# Patient Record
Sex: Female | Born: 1945 | Race: White | Hispanic: No | Marital: Married | Smoking: Current every day smoker
Health system: Southern US, Community
[De-identification: ages and names within clinical notes are randomized; demographics above are authoritative.]

## PROBLEM LIST (undated history)

## (undated) DIAGNOSIS — I10 Essential (primary) hypertension: Secondary | ICD-10-CM

## (undated) DIAGNOSIS — I7143 Infrarenal abdominal aortic aneurysm, without rupture: Secondary | ICD-10-CM

## (undated) DIAGNOSIS — E785 Hyperlipidemia, unspecified: Secondary | ICD-10-CM

## (undated) DIAGNOSIS — S065XAA Traumatic subdural hemorrhage with loss of consciousness status unknown, initial encounter: Secondary | ICD-10-CM

## (undated) DIAGNOSIS — I70219 Atherosclerosis of native arteries of extremities with intermittent claudication, unspecified extremity: Secondary | ICD-10-CM

## (undated) DIAGNOSIS — R011 Cardiac murmur, unspecified: Secondary | ICD-10-CM

## (undated) DIAGNOSIS — E039 Hypothyroidism, unspecified: Secondary | ICD-10-CM

## (undated) DIAGNOSIS — G912 (Idiopathic) normal pressure hydrocephalus: Secondary | ICD-10-CM

## (undated) DIAGNOSIS — I700 Atherosclerosis of aorta: Secondary | ICD-10-CM

## (undated) DIAGNOSIS — F32A Depression, unspecified: Secondary | ICD-10-CM

## (undated) DIAGNOSIS — M1990 Unspecified osteoarthritis, unspecified site: Secondary | ICD-10-CM

## (undated) DIAGNOSIS — I2510 Atherosclerotic heart disease of native coronary artery without angina pectoris: Secondary | ICD-10-CM

## (undated) DIAGNOSIS — Z7902 Long term (current) use of antithrombotics/antiplatelets: Secondary | ICD-10-CM

## (undated) DIAGNOSIS — Z87442 Personal history of urinary calculi: Secondary | ICD-10-CM

## (undated) DIAGNOSIS — I739 Peripheral vascular disease, unspecified: Secondary | ICD-10-CM

## (undated) DIAGNOSIS — D649 Anemia, unspecified: Secondary | ICD-10-CM

## (undated) DIAGNOSIS — I5189 Other ill-defined heart diseases: Secondary | ICD-10-CM

## (undated) HISTORY — PX: ABDOMINAL HYSTERECTOMY: SHX81

## (undated) HISTORY — PX: EYE SURGERY: SHX253

---

## 1968-10-06 HISTORY — PX: NASAL SINUS SURGERY: SHX719

## 2020-10-06 HISTORY — PX: OTHER SURGICAL HISTORY: SHX169

## 2021-01-08 DIAGNOSIS — G912 (Idiopathic) normal pressure hydrocephalus: Secondary | ICD-10-CM | POA: Insufficient documentation

## 2021-03-20 DIAGNOSIS — I714 Abdominal aortic aneurysm, without rupture, unspecified: Secondary | ICD-10-CM | POA: Insufficient documentation

## 2021-03-20 DIAGNOSIS — E7800 Pure hypercholesterolemia, unspecified: Secondary | ICD-10-CM | POA: Insufficient documentation

## 2021-03-20 DIAGNOSIS — Z720 Tobacco use: Secondary | ICD-10-CM | POA: Insufficient documentation

## 2021-03-20 DIAGNOSIS — I739 Peripheral vascular disease, unspecified: Secondary | ICD-10-CM | POA: Insufficient documentation

## 2021-03-20 DIAGNOSIS — I10 Essential (primary) hypertension: Secondary | ICD-10-CM | POA: Insufficient documentation

## 2021-03-20 DIAGNOSIS — I70219 Atherosclerosis of native arteries of extremities with intermittent claudication, unspecified extremity: Secondary | ICD-10-CM | POA: Insufficient documentation

## 2021-07-02 DIAGNOSIS — S065XAA Traumatic subdural hemorrhage with loss of consciousness status unknown, initial encounter: Secondary | ICD-10-CM | POA: Insufficient documentation

## 2021-07-02 DIAGNOSIS — Z982 Presence of cerebrospinal fluid drainage device: Secondary | ICD-10-CM | POA: Insufficient documentation

## 2021-07-02 DIAGNOSIS — G918 Other hydrocephalus: Secondary | ICD-10-CM | POA: Insufficient documentation

## 2021-07-03 DIAGNOSIS — J95821 Acute postprocedural respiratory failure: Secondary | ICD-10-CM | POA: Insufficient documentation

## 2021-10-31 LAB — LIPID PANEL
Cholesterol: 222 — AB (ref 0–200)
HDL: 64 (ref 35–70)
LDL Cholesterol: 127
Triglycerides: 180 — AB (ref 40–160)

## 2021-10-31 LAB — HEPATIC FUNCTION PANEL
ALT: 11 U/L (ref 7–35)
AST: 13 (ref 13–35)
Alkaline Phosphatase: 98 (ref 25–125)
Bilirubin, Total: 0.2

## 2021-10-31 LAB — BASIC METABOLIC PANEL
BUN: 16 (ref 4–21)
CO2: 23 — AB (ref 13–22)
Chloride: 100 (ref 99–108)
Creatinine: 0.9 (ref 0.5–1.1)
Glucose: 73
Potassium: 4.7 mEq/L (ref 3.5–5.1)
Sodium: 136 — AB (ref 137–147)

## 2021-10-31 LAB — TSH: TSH: 2.83 (ref 0.41–5.90)

## 2021-10-31 LAB — CBC AND DIFFERENTIAL
HCT: 37 (ref 36–46)
Hemoglobin: 12.4 (ref 12.0–16.0)
Platelets: 330 10*3/uL (ref 150–400)
WBC: 8.4

## 2021-10-31 LAB — VITAMIN D 25 HYDROXY (VIT D DEFICIENCY, FRACTURES): Vit D, 25-Hydroxy: 59.1

## 2021-10-31 LAB — HEMOGLOBIN A1C: Hemoglobin A1C: 5.6

## 2021-10-31 LAB — COMPREHENSIVE METABOLIC PANEL
Albumin: 4.6 (ref 3.5–5.0)
Calcium: 9.9 (ref 8.7–10.7)
Globulin: 2.6

## 2021-10-31 LAB — CBC: RBC: 3.93 (ref 3.87–5.11)

## 2021-11-05 ENCOUNTER — Other Ambulatory Visit: Payer: Self-pay

## 2021-11-05 ENCOUNTER — Ambulatory Visit (INDEPENDENT_AMBULATORY_CARE_PROVIDER_SITE_OTHER): Payer: Medicare Other | Admitting: Vascular Surgery

## 2021-11-05 VITALS — BP 122/73 | HR 96 | Ht 60.0 in | Wt 108.0 lb

## 2021-11-05 DIAGNOSIS — I10 Essential (primary) hypertension: Secondary | ICD-10-CM

## 2021-11-05 DIAGNOSIS — I7143 Infrarenal abdominal aortic aneurysm, without rupture: Secondary | ICD-10-CM

## 2021-11-05 DIAGNOSIS — E7800 Pure hypercholesterolemia, unspecified: Secondary | ICD-10-CM | POA: Diagnosis not present

## 2021-11-05 DIAGNOSIS — I70211 Atherosclerosis of native arteries of extremities with intermittent claudication, right leg: Secondary | ICD-10-CM | POA: Diagnosis not present

## 2021-11-05 NOTE — Assessment & Plan Note (Addendum)
blood pressure control important in reducing the progression of atherosclerotic disease and AAA growth. On appropriate oral medications.  

## 2021-11-05 NOTE — Assessment & Plan Note (Signed)
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  This will be addressed at the same time as her aneurysm repair either with iliac artery recanalization or left right femoral to femoral bypass.

## 2021-11-05 NOTE — Assessment & Plan Note (Signed)
lipid control important in reducing the progression of atherosclerotic disease. Continue statin therapy  

## 2021-11-05 NOTE — H&P (View-Only) (Signed)
Patient ID: Tracy Parsons, female   DOB: [birth year], 76 y.o.   MRN: [id number]  Chief Complaint  Patient presents with   New Patient (Initial Visit)    NP consult    HPI Tracy Parsons is a 76 y.o. female.  I am asked to see the patient by Dr. 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  The patient reports severe claudication symptoms of the right leg with 100 foot or so claudication that has progressed over months to years.  No clear cause or inciting event that started the symptoms.  The patient reports chronic back pain and has a known abdominal aortic aneurysm which they have been following for many years.  Apparently, on most recent evaluation this measured up to 5.5 cm in maximal diameter.  This is associated with an iliac artery occlusion.  I got the report of the CT scan but not have not actually gotten to review the actual images.  She was seen by a surgeon in [place] who discussed aorta unit iliac stent graft with femoral to femoral bypass which was certainly a reasonable option.  They have moved to this area.  She is here to discuss repair of her aneurysm as well as treatment for her atherosclerotic occlusive disease.  No rest pain.  No ulceration..     Past medical history Claudication Hypertension Hyperlipidemia Hypothyroidism  Family history No bleeding disorders, clotting disorders, autoimmune diseases, or aneurysms  Social history Married, recently moved to the area, retired No alcohol or drug abuse  Allergies  Allergen Reactions   Sulfa Antibiotics Other (See Comments)    Unknown reaction Unknown-- reaction as child     Current Outpatient Medications  Medication Sig Dispense Refill   ascorbic acid (VITAMIN C) 1000 MG tablet Take by mouth.     Aspirin 81 MG CAPS Take by mouth.     Calcium Citrate-Vitamin D3 1000-0.01 MG/30ML LIQD Take 2 tablets by mouth every morning.     cilostazol (PLETAL) 50 MG  tablet Take 50 mg by mouth 2 (two) times daily.     levothyroxine (SYNTHROID) 75 MCG tablet Take 1 tablet by mouth daily.     lisinopril (ZESTRIL) 20 MG tablet Take 20 mg by mouth daily.     lovastatin (MEVACOR) 40 MG tablet Take by mouth.     Multiple Vitamin (MULTIVITAMIN) tablet Take 1 tablet by mouth every morning.     sertraline (ZOLOFT) 100 MG tablet Take by mouth.     No current facility-administered medications for this visit.      REVIEW OF SYSTEMS (Negative unless checked)  Constitutional: [] Weight loss  [] Fever  [] Chills Cardiac: [] Chest pain   [] Chest pressure   [] Palpitations   [] Shortness of breath when laying flat   [] Shortness of breath at rest   [] Shortness of breath with exertion. Vascular:  [x] Pain in legs with walking   [] Pain in legs at rest   [] Pain in legs when laying flat   [x] Claudication   [] Pain in feet when walking  [] Pain in feet at rest  [] Pain in feet when laying flat   [] History of DVT   [] Phlebitis   [] Swelling in legs   [] Varicose veins   [] Non-healing ulcers Pulmonary:   [] Uses home oxygen   [] Productive cough   [] Hemoptysis   [] Wheeze  [] COPD   [] Asthma Neurologic:  [] Dizziness  [] Blackouts   [] Seizures   [] History of stroke   [] History of  TIA  [] Aphasia   [] Temporary blindness   [] Dysphagia   [] Weakness or numbness in arms   [] Weakness or numbness in legs Musculoskeletal:  [x] Arthritis   [] Joint swelling   [] Joint pain   [x] Low back pain Hematologic:  [] Easy bruising  [] Easy bleeding   [] Hypercoagulable state   [] Anemic  [] Hepatitis Gastrointestinal:  [] Blood in stool   [] Vomiting blood  [x] Gastroesophageal reflux/heartburn   [] Abdominal pain Genitourinary:  [] Chronic kidney disease   [] Difficult urination  [] Frequent urination  [] Burning with urination   [] Hematuria Skin:  [] Rashes   [] Ulcers   [] Wounds Psychological:  [] History of anxiety   [x]  History of major depression.    Physical Exam BP 122/73    Pulse 96    Ht 5' (1.524 m)    Wt 108 lb (49  kg)    BMI 21.09 kg/m  Gen:  WD/WN, NAD. Appears younger than stated age. Head: [place]/AT, No temporalis wasting.  Ear/Nose/Throat: Hearing grossly intact, nares w/o erythema or drainage, oropharynx w/o Erythema/Exudate Eyes: Conjunctiva clear, sclera non-icteric  Neck: trachea midline.  No JVD.  Pulmonary:  Good air movement, respirations not labored, no use of accessory muscles  Cardiac: RRR, no JVD Vascular:  Vessel Right Left  Radial Palpable Palpable                          DP trace 1+  PT NP 1+   Gastrointestinal:. No masses, surgical incisions, or scars. Increased aortic impulse Musculoskeletal: M/S 5/5 throughout.  Extremities without ischemic changes.  No deformity or atrophy. No edema. Neurologic: Sensation grossly intact in extremities.  Symmetrical.  Speech is fluent. Motor exam as listed above. Psychiatric: Judgment intact, Mood & affect appropriate for pt's clinical situation. Dermatologic: No rashes or ulcers noted.  No cellulitis or open wounds.    Radiology No results found.  Labs No results found for this or any previous visit (from the past 2160 hour(s)).  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  I do not have the actual images for review, but she has disabling claudication symptoms as well as an aneurysm that clearly needs to be fixed for size criteria.  We are going to try to obtain the actual images. I discussed that are going to be 3 options for fixing this, and I would prefer the first or second option depending on the anatomy we encountered at surgery.  The first option would be to recanalize the right iliac artery and treat this with a conventional stent graft with iliac extension stents down beyond the occlusion.  Dissection option would be an aorta uni Iliac stent to the left side with a left to right femoral-femoral bypass.  Either of these  2 options would be reasonable but if possible to cross, I would prefer the first.  The third option would be an open repair with an aortobifemoral bypass which is reasonable but much more morbid.  I will try to obtain the images but we will plan an abdominal aortic aneurysm repair plus or minus a femoral to femoral bypass versus iliac artery recanalization in the near future at her convenience.  Risks and benefits are discussed.  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  This will be addressed at the same time as her aneurysm repair either with iliac artery recanalization  or left right femoral to femoral bypass.  Essential hypertension blood pressure control important in reducing the progression of atherosclerotic disease and AAA growth. On appropriate oral medications.   Pure hypercholesterolemia lipid control important in reducing the progression of atherosclerotic disease. Continue statin therapy      Leotis Pain 11/05/2021, 4:15 PM   This note was created with Dragon medical transcription system.  Any errors from dictation are unintentional.

## 2021-11-05 NOTE — Progress Notes (Signed)
Patient ID: Tracy Parsons, female   DOB: 01/10/1946, 76 y.o.   MRN: [id number]  Chief Complaint  Patient presents with   New Patient (Initial Visit)    NP consult    HPI Tracy Parsons is a 76 y.o. female.  I am asked to see the patient by Dr. 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  The patient reports severe claudication symptoms of the right leg with 100 foot or so claudication that has progressed over months to years.  No clear cause or inciting event that started the symptoms.  The patient reports chronic back pain and has a known abdominal aortic aneurysm which they have been following for many years.  Apparently, on most recent evaluation this measured up to 5.5 cm in maximal diameter.  This is associated with an iliac artery occlusion.  I got the report of the CT scan but not have not actually gotten to review the actual images.  She was seen by a surgeon in [place] who discussed aorta unit iliac stent graft with femoral to femoral bypass which was certainly a reasonable option.  They have moved to this area.  She is here to discuss repair of her aneurysm as well as treatment for her atherosclerotic occlusive disease.  No rest pain.  No ulceration..     Past medical history Claudication Hypertension Hyperlipidemia Hypothyroidism  Family history No bleeding disorders, clotting disorders, autoimmune diseases, or aneurysms  Social history Married, recently moved to the area, retired No alcohol or drug abuse  Allergies  Allergen Reactions   Sulfa Antibiotics Other (See Comments)    Unknown reaction Unknown-- reaction as child     Current Outpatient Medications  Medication Sig Dispense Refill   ascorbic acid (VITAMIN C) 1000 MG tablet Take by mouth.     Aspirin 81 MG CAPS Take by mouth.     Calcium Citrate-Vitamin D3 1000-0.01 MG/30ML LIQD Take 2 tablets by mouth every morning.     cilostazol (PLETAL) 50 MG  tablet Take 50 mg by mouth 2 (two) times daily.     levothyroxine (SYNTHROID) 75 MCG tablet Take 1 tablet by mouth daily.     lisinopril (ZESTRIL) 20 MG tablet Take 20 mg by mouth daily.     lovastatin (MEVACOR) 40 MG tablet Take by mouth.     Multiple Vitamin (MULTIVITAMIN) tablet Take 1 tablet by mouth every morning.     sertraline (ZOLOFT) 100 MG tablet Take by mouth.     No current facility-administered medications for this visit.      REVIEW OF SYSTEMS (Negative unless checked)  Constitutional: [] Weight loss  [] Fever  [] Chills Cardiac: [] Chest pain   [] Chest pressure   [] Palpitations   [] Shortness of breath when laying flat   [] Shortness of breath at rest   [] Shortness of breath with exertion. Vascular:  [x] Pain in legs with walking   [] Pain in legs at rest   [] Pain in legs when laying flat   [x] Claudication   [] Pain in feet when walking  [] Pain in feet at rest  [] Pain in feet when laying flat   [] History of DVT   [] Phlebitis   [] Swelling in legs   [] Varicose veins   [] Non-healing ulcers Pulmonary:   [] Uses home oxygen   [] Productive cough   [] Hemoptysis   [] Wheeze  [] COPD   [] Asthma Neurologic:  [] Dizziness  [] Blackouts   [] Seizures   [] History of stroke   [] History of  TIA  [] Aphasia   [] Temporary blindness   [] Dysphagia   [] Weakness or numbness in arms   [] Weakness or numbness in legs Musculoskeletal:  [x] Arthritis   [] Joint swelling   [] Joint pain   [x] Low back pain Hematologic:  [] Easy bruising  [] Easy bleeding   [] Hypercoagulable state   [] Anemic  [] Hepatitis Gastrointestinal:  [] Blood in stool   [] Vomiting blood  [x] Gastroesophageal reflux/heartburn   [] Abdominal pain Genitourinary:  [] Chronic kidney disease   [] Difficult urination  [] Frequent urination  [] Burning with urination   [] Hematuria Skin:  [] Rashes   [] Ulcers   [] Wounds Psychological:  [] History of anxiety   [x]  History of major depression.    Physical Exam BP 122/73    Pulse 96    Ht 5' (1.524 m)    Wt 108 lb (49  kg)    BMI 21.09 kg/m  Gen:  WD/WN, NAD. Appears younger than stated age. Head: [place]/AT, No temporalis wasting.  Ear/Nose/Throat: Hearing grossly intact, nares w/o erythema or drainage, oropharynx w/o Erythema/Exudate Eyes: Conjunctiva clear, sclera non-icteric  Neck: trachea midline.  No JVD.  Pulmonary:  Good air movement, respirations not labored, no use of accessory muscles  Cardiac: RRR, no JVD Vascular:  Vessel Right Left  Radial Palpable Palpable                          DP trace 1+  PT NP 1+   Gastrointestinal:. No masses, surgical incisions, or scars. Increased aortic impulse Musculoskeletal: M/S 5/5 throughout.  Extremities without ischemic changes.  No deformity or atrophy. No edema. Neurologic: Sensation grossly intact in extremities.  Symmetrical.  Speech is fluent. Motor exam as listed above. Psychiatric: Judgment intact, Mood & affect appropriate for pt's clinical situation. Dermatologic: No rashes or ulcers noted.  No cellulitis or open wounds.    Radiology No results found.  Labs No results found for this or any previous visit (from the past 2160 hour(s)).  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  I do not have the actual images for review, but she has disabling claudication symptoms as well as an aneurysm that clearly needs to be fixed for size criteria.  We are going to try to obtain the actual images. I discussed that are going to be 3 options for fixing this, and I would prefer the first or second option depending on the anatomy we encountered at surgery.  The first option would be to recanalize the right iliac artery and treat this with a conventional stent graft with iliac extension stents down beyond the occlusion.  Dissection option would be an aorta uni Iliac stent to the left side with a left to right femoral-femoral bypass.  Either of these  2 options would be reasonable but if possible to cross, I would prefer the first.  The third option would be an open repair with an aortobifemoral bypass which is reasonable but much more morbid.  I will try to obtain the images but we will plan an abdominal aortic aneurysm repair plus or minus a femoral to femoral bypass versus iliac artery recanalization in the near future at her convenience.  Risks and benefits are discussed.  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  This will be addressed at the same time as her aneurysm repair either with iliac artery recanalization  or left right femoral to femoral bypass.  Essential hypertension blood pressure control important in reducing the progression of atherosclerotic disease and AAA growth. On appropriate oral medications.   Pure hypercholesterolemia lipid control important in reducing the progression of atherosclerotic disease. Continue statin therapy      Leotis Pain 11/05/2021, 4:15 PM   This note was created with Dragon medical transcription system.  Any errors from dictation are unintentional.

## 2021-11-05 NOTE — Assessment & Plan Note (Signed)
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  I do not have the actual images for review, but she has disabling claudication symptoms as well as an aneurysm that clearly needs to be fixed for size criteria.  We are going to try to obtain the actual images. I discussed that are going to be 3 options for fixing this, and I would prefer the first or second option depending on the anatomy we encountered at surgery.  The first option would be to recanalize the right iliac artery and treat this with a conventional stent graft with iliac extension stents down beyond the occlusion.  Dissection option would be an aorta uni Iliac stent to the left side with a left to right femoral-femoral bypass.  Either of these 2 options would be reasonable but if possible to cross, I would prefer the first.  The third option would be an open repair with an aortobifemoral bypass which is reasonable but much more morbid.  I will try to obtain the images but we will plan an abdominal aortic aneurysm repair plus or minus a femoral to femoral bypass versus iliac artery recanalization in the near future at her convenience.  Risks and benefits are discussed.

## 2021-11-05 NOTE — Patient Instructions (Signed)
Abdominal Aortic Aneurysm  An aneurysm is a bulge in a blood vessel that carries blood away from the heart (artery). It happens when blood pushes against a weak or damaged place on the wall of the blood vessel. An abdominal aortic aneurysm happens in the main blood vessel that carries blood away from the heart (aorta). Most aneurysms do not cause problems, but some do cause problems. If an aneurysm grows, it can burst or tear. This causes bleeding inside you. It is anemergency. It can be life-threatening. What are the causes? The exact cause of this condition is not known. What increases the risk? The following factors may make you more likely to develop this condition: Being female and 78 years of age or older. Being of [place] descent. Using nicotine or tobacco now or in the past. Having a family history of aneurysms. Having any of these problems: Hardening of blood vessels that carry blood away from the heart. Irritation and swelling of the walls of blood vessels that carry blood away from the heart. Certain genetic problems. Being very overweight. An infection in the wall of your aorta. High cholesterol. High blood pressure (hypertension). What are the signs or symptoms? Symptoms depend on the size of your aneurysm and how fast it is growing. Most aneurysms grow slowly and do not cause symptoms. If symptoms happen, you may: Have very bad pain in your belly (abdomen), side, or low back. Feel full after eating only a little food. Feel a throbbing lump in your belly. Have these problems with your feet or toes: Pain. Skin turning blue. Sores. Have trouble pooping (constipation). Have trouble peeing (urinating). If your aneurysm bursts, you may: Feel sudden, very bad pain in the belly, side, or back. Feel like you may vomit. Vomit. Feel light-headed. Faint. How is this treated? Treatment for this condition depends on: The size of your aneurysm. How fast it is  growing. Your age. Your risk of having the aneurysm burst. If your aneurysm is smaller than 2 inches (5 cm), your doctor may: Check it often to see if it is growing. You may have an imaging test (ultrasound) to check it every 3-6 months, every year, or every few years. Give you medicines for: High blood pressure. Pain. Infection. If your aneurysm is larger than 2 inches (5 cm), you may need surgery to fix it. Follow these instructions at home: Eating and drinking  Eat a heart-healthy diet. Eat a lot of: Fresh fruits and vegetables. Whole grains. Low-fat (lean) protein. Low-fat dairy products. Avoid foods that are high in saturated fat and cholesterol. These foods include red meat and some dairy products.  Lifestyle     Do not use any products that contain nicotine or tobacco, such as cigarettes, e-cigarettes, and chewing tobacco. If you need help quitting, ask your doctor. Stay active and get exercise. Ask your doctor how often to exercise and what types of exercise are safe for you. Keep a healthy weight. Alcohol use Do not drink alcohol if: Your doctor tells you not to drink. You are pregnant, may be pregnant, or are planning to become pregnant. If you drink alcohol: Limit how much you use to: 0-1 drink a day for women. 0-2 drinks a day for men. Be aware of how much alcohol is in your drink. In the [place] one drink equals one 12 oz bottle of beer (355 mL), one 5 oz glass of wine (148 mL), or one 1 oz glass of hard liquor (44 mL). General instructions   Take over-the-counter and prescription medicines only as told by your doctor. Keep your blood pressure in a normal range. Check it at regular times. Ask your doctor what level it should be. Have regular checks of your levels of blood sugar (glucose) and cholesterol. Follow steps to keep these levels near normal. Avoid heavy lifting and activities that take a lot of effort. Ask what activities are safe for you. If you can, learn  your family's health history. Keep all follow-up visits as told by your doctor. This is important. Contact a doctor if: Your belly, side, or back hurts. Your belly throbs. You have a fever. Get help right away if: You have sudden, bad pain in your belly, side, or back. You feel like you may vomit or you vomit. You feel light-headed or you faint. Your heart beats fast when you stand. You have sweaty skin that is cold to the touch (clammy). You are short of breath. You have trouble pooping. You have trouble peeing. These symptoms may be an emergency. Do not wait to see if the symptoms will go away. Get medical help right away. Call your local emergency services (911 in the [place] Do not drive yourself to the hospital. Summary An aneurysm is a bulge in one of the blood vessels that carry blood away from the heart (artery). An abdominal aortic aneurysm happens in the main blood vessel that carries blood away from the heart (aorta). This condition can cause bleeding inside the body. It can be life-threatening. Risk can rise if you are female, age 78 or older, and of [place] descent. Risk can also rise from nicotine or tobacco use or having aneurysms in the family. Get help right away if you have symptoms of a burst aneurysm. This information is not intended to replace advice given to you by your health care provider. Make sure you discuss any questions you have with your healthcare provider. Document Revised: 07/08/2019 Document Reviewed: 07/08/2019 Elsevier Patient Education  2022 [employer]  

## 2021-11-08 ENCOUNTER — Encounter: Payer: Self-pay | Admitting: Cardiology

## 2021-11-08 ENCOUNTER — Other Ambulatory Visit: Payer: Self-pay

## 2021-11-08 ENCOUNTER — Ambulatory Visit: Payer: Medicare Other | Admitting: Cardiology

## 2021-11-08 VITALS — BP 110/70 | HR 83 | Ht 60.0 in | Wt 108.4 lb

## 2021-11-08 DIAGNOSIS — I10 Essential (primary) hypertension: Secondary | ICD-10-CM | POA: Diagnosis not present

## 2021-11-08 DIAGNOSIS — E7800 Pure hypercholesterolemia, unspecified: Secondary | ICD-10-CM

## 2021-11-08 DIAGNOSIS — F17200 Nicotine dependence, unspecified, uncomplicated: Secondary | ICD-10-CM

## 2021-11-08 DIAGNOSIS — I739 Peripheral vascular disease, unspecified: Secondary | ICD-10-CM

## 2021-11-08 DIAGNOSIS — Z01810 Encounter for preprocedural cardiovascular examination: Secondary | ICD-10-CM

## 2021-11-08 NOTE — Progress Notes (Signed)
Cardiology Office Note:    Date:  11/08/2021   ID:  Adelene Amas, DOB [birth year], MRN [id number]  PCP:  Pcp, No   [facility] Providers Cardiologist:  None     Referring MD: Algernon Huxley, MD   Chief Complaint  Patient presents with   New Patient (Initial Visit)    Needs cardiac Clearance -- AAA and "blockage in right leg" Meds reviewed verbally with patient.     History of Present Illness:    Mera Zubek is a 76 y.o. female with a hx of hypertension, hyperlipidemia, PAD (abdominal aortic aneurysm, right iliac artery occlusion), current smoker x40+ years presenting for preop evaluation.  Patient has symptoms of claudication, saw vascular surgery testing revealed 5.5 cm abdominal aortic aneurysm, and iliac artery disease.  Surgical intervention is being planned.  She denies chest pain or shortness of breath.  Previously took Lipitor but developed significant memory issues, this was stopped.  Currently takes lovastatin and is tolerable.  History reviewed. No pertinent past medical history.  History reviewed. No pertinent surgical history.  Current Medications: Current Meds  Medication Sig   ascorbic acid (VITAMIN C) 1000 MG tablet Take by mouth.   Aspirin 81 MG CAPS Take by mouth.   Calcium Citrate-Vitamin D3 1000-0.01 MG/30ML LIQD Take 2 tablets by mouth every morning.   cilostazol (PLETAL) 50 MG tablet Take 50 mg by mouth 2 (two) times daily.   levothyroxine (SYNTHROID) 75 MCG tablet Take 1 tablet by mouth daily.   lisinopril (ZESTRIL) 20 MG tablet Take 20 mg by mouth daily.   lovastatin (MEVACOR) 40 MG tablet Take by mouth.   Multiple Vitamin (MULTIVITAMIN) tablet Take 1 tablet by mouth every morning.   sertraline (ZOLOFT) 100 MG tablet Take by mouth.     Allergies:   Lipitor [atorvastatin] and Sulfa antibiotics   Social History   Socioeconomic History   Marital status: Married    Spouse name: Not on file   Number of children: Not on file   Years of  education: Not on file   Highest education level: Not on file  Occupational History   Not on file  Tobacco Use   Smoking status: Every Day    Types: Cigarettes   Smokeless tobacco: Never  Vaping Use   Vaping Use: Never used  Substance and Sexual Activity   Alcohol use: Yes   Drug use: Never   Sexual activity: Not on file  Other Topics Concern   Not on file  Social History Narrative   Not on file   Social Determinants of Health   Financial Resource Strain: Not on file  Food Insecurity: Not on file  Transportation Needs: Not on file  Physical Activity: Not on file  Stress: Not on file  Social Connections: Not on file     Family History: The patient's family history is not on file.  ROS:   Please see the history of present illness.     All other systems reviewed and are negative.  EKGs/Labs/Other Studies Reviewed:    The following studies were reviewed today:   EKG:  EKG is  ordered today.  The ekg ordered today demonstrates normal sinus rhythm, possible old inferior infarct  Recent Labs: No results found for requested labs within last 8760 hours.  Recent Lipid Panel No results found for: CHOL, TRIG, HDL, CHOLHDL, VLDL, LDLCALC, LDLDIRECT   Risk Assessment/Calculations:          Physical Exam:    VS:  BP 110/70 (  BP Location: Left Arm, Patient Position: Sitting, Cuff Size: Normal)    Pulse 83    Ht 5' (1.524 m)    Wt 108 lb 6 oz (49.2 kg)    SpO2 96%    BMI 21.17 kg/m     Wt Readings from Last 3 Encounters:  11/08/21 108 lb 6 oz (49.2 kg)  11/05/21 108 lb (49 kg)     GEN:  Well nourished, well developed in no acute distress HEENT: Normal NECK: No JVD; No carotid bruits LYMPHATICS: No lymphadenopathy CARDIAC: RRR, no murmurs, rubs, gallops RESPIRATORY: Diminished breath sounds, clear. ABDOMEN: Soft, non-tender, non-distended MUSCULOSKELETAL:  No edema; No deformity  SKIN: Warm and dry NEUROLOGIC:  Alert and oriented x 3 PSYCHIATRIC:  Normal affect    ASSESSMENT:    1. Pre-operative cardiovascular examination   2. PAD (peripheral artery disease) (HCC)   3. Primary hypertension   4. Pure hypercholesterolemia   5. Smoking    PLAN:    In order of problems listed above:  Preop evaluation prior to vascular surgery.  Vascular surgery usually deemed high risk from a cardiac perspective.  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  Patient denies chest pain or shortness of breath.  Get echocardiogram to evaluate any structural abnormalities, get Lexiscan Myoview to evaluate presence of ischemia.  If no significant findings on echo or Lexiscan, okay for procedure. AAA, right iliac artery occlusion.  Continue aspirin, statin.  Follows with vascular surgery. Hypertension, BP controlled.  Continue lisinopril 20 mg daily. Hyperlipidemia, obtain fasting lipid profile from PCP.  Continue lovastatin.  Could not tolerate Lipitor in the past. Current smoker, cessation advised.  Follow-up after cardiac testing.      Shared Decision Making/Informed Consent The risks [chest pain, shortness of breath, cardiac arrhythmias, dizziness, blood pressure fluctuations, myocardial infarction, stroke/transient ischemic attack, nausea, vomiting, allergic reaction, radiation exposure, metallic taste sensation and life-threatening complications (estimated to be 1 in 10,000)], benefits (risk stratification, diagnosing coronary artery disease, treatment guidance) and alternatives of a nuclear stress test were discussed in detail with Ms. Cake and she agrees to proceed.    Medication Adjustments/Labs and Tests Ordered: Current medicines are reviewed at length with the patient today.  Concerns regarding medicines are outlined above.  Orders Placed This Encounter  Procedures   NM Myocar Multi W/Spect W/Wall Motion / EF   EKG 12-Lead   ECHOCARDIOGRAM COMPLETE   No orders of the defined types were placed in this encounter.   Patient Instructions  Medication  Instructions:  Your physician recommends that you continue on your current medications as directed. Please refer to the Current Medication list given to you today.  *If you need a refill on your cardiac medications before your next appointment, please call your pharmacy*   Lab Work: None ordered If you have labs (blood work) drawn today and your tests are completely normal, you will receive your results only by: MyChart Message (if you have MyChart) OR A paper copy in the mail If you have any lab test that is abnormal or we need to change your treatment, we will call you to review the results.   Testing/Procedures:  Your physician has requested that you have an echocardiogram. Echocardiography is a painless test that uses sound waves to create images of your heart. It provides your doctor with information about the size and shape of your heart and how well your hearts chambers and valves are working. This procedure takes approximately one hour. There are no restrictions for  this procedure.   2.   [place]       Your caregiver has ordered a Stress Test with nuclear imaging. The purpose of this test is to evaluate the blood supply to your heart muscle. This procedure is referred to as a "Non-Invasive Stress Test." This is because other than having an IV started in your vein, nothing is inserted or "invades" your body. Cardiac stress tests are done to find areas of poor blood flow to the heart by determining the extent of coronary artery disease (CAD). Some patients exercise on a treadmill, which naturally increases the blood flow to your heart, while others who are  unable to walk on a treadmill due to physical limitations have a pharmacologic/chemical stress agent called Lexiscan . This medicine will mimic walking on a treadmill by temporarily increasing your coronary blood flow.      PLEASE REPORT TO [facility] MEDICAL MALL ENTRANCE   THE VOLUNTEERS AT THE FIRST DESK WILL DIRECT YOU WHERE TO  GO     *Please note: these test may take anywhere between 2-4 hours to complete       Date of Procedure:_____________________________________   Arrival Time for Procedure:______________________________    PLEASE NOTIFY THE OFFICE AT LEAST 24 HOURS IN ADVANCE IF YOU ARE UNABLE TO KEEP YOUR APPOINTMENT.  [place] 24 HOURS IN ADVANCE IF YOU ARE UNABLE TO KEEP YOUR APPOINTMENT. [phone]         How to prepare for your Myoview test:         1. Do not eat or drink after midnight  2. No caffeine for 24 hours prior to test  3. No smoking 24 hours prior to test.  4. Unless instructed otherwise, Take your medication with a small sips of water.    5.         Ladies, please do not wear dresses. Skirts or pants are appropriate. Please wear a short sleeve shirt.  6. No perfume, cologne or lotion.  7. Wear comfortable walking shoes. No heels!   Follow-Up: At [facility], you and your health needs are our priority.  As part of our continuing mission to provide you with exceptional heart care, we have created designated Provider Care Teams.  These Care Teams include your primary Cardiologist (physician) and Advanced Practice Providers (APPs -  Physician Assistants and Nurse Practitioners) who all work together to provide you with the care you need, when you need it.  We recommend signing up for the patient portal called "MyChart".  Sign up information is provided on this After Visit Summary.  MyChart is used to connect with patients for Virtual Visits (Telemedicine).  Patients are able to view lab/test results, encounter notes, upcoming appointments, etc.  Non-urgent messages can be sent to your provider as well.   To learn more about what you can do with MyChart, go to [email or web address].    Your next appointment:   Follow up after testing   The format for your next appointment:   In Person  Provider:   You may see Kate Sable, MD or one of the following Advanced Practice Providers on your designated Care Team:   Murray Hodgkins, NP Christell Faith, PA-C Cadence Kathlen Mody, [place]    Other Instructions     Signed, Kate Sable, MD  11/08/2021 12:46 PM    [place]

## 2021-11-08 NOTE — Patient Instructions (Signed)
Medication Instructions:  Your physician recommends that you continue on your current medications as directed. Please refer to the Current Medication list given to you today.  *If you need a refill on your cardiac medications before your next appointment, please call your pharmacy*   Lab Work: None ordered If you have labs (blood work) drawn today and your tests are completely normal, you will receive your results only by: [place] (if you have MyChart) OR A paper copy in the mail If you have any lab test that is abnormal or we need to change your treatment, we will call you to review the results.   Testing/Procedures:  Your physician has requested that you have an echocardiogram. Echocardiography is a painless test that uses sound waves to create images of your heart. It provides your doctor with information about the size and shape of your heart and how well your hearts chambers and valves are working. This procedure takes approximately one hour. There are no restrictions for this procedure.   2.   [place]       Your caregiver has ordered a Stress Test with nuclear imaging. The purpose of this test is to evaluate the blood supply to your heart muscle. This procedure is referred to as a "Non-Invasive Stress Test." This is because other than having an IV started in your vein, nothing is inserted or "invades" your body. Cardiac stress tests are done to find areas of poor blood flow to the heart by determining the extent of coronary artery disease (CAD). Some patients exercise on a treadmill, which naturally increases the blood flow to your heart, while others who are  unable to walk on a treadmill due to physical limitations have a pharmacologic/chemical stress agent called Lexiscan . This medicine will mimic walking on a treadmill by temporarily increasing your coronary blood flow.      PLEASE REPORT TO [facility] MEDICAL MALL ENTRANCE   THE VOLUNTEERS AT THE FIRST DESK WILL DIRECT  YOU WHERE TO GO     *Please note: these test may take anywhere between 2-4 hours to complete       Date of Procedure:_____________________________________   Arrival Time for Procedure:______________________________    PLEASE NOTIFY THE OFFICE AT LEAST 24 HOURS IN ADVANCE IF YOU ARE UNABLE TO KEEP YOUR APPOINTMENT.  [place] 24 HOURS IN ADVANCE IF YOU ARE UNABLE TO KEEP YOUR APPOINTMENT. [phone]         How to prepare for your Myoview test:         1. Do not eat or drink after midnight  2. No caffeine for 24 hours prior to test  3. No smoking 24 hours prior to test.  4. Unless instructed otherwise, Take your medication with a small sips of water.    5.         Ladies, please do not wear dresses. Skirts or pants are appropriate. Please wear a short sleeve shirt.  6. No perfume, cologne or lotion.  7. Wear comfortable walking shoes. No heels!   Follow-Up: At [facility], you and your health needs are our priority.  As part of our continuing mission to provide you with exceptional heart care, we have created designated Provider Care Teams.  These Care Teams include your primary Cardiologist (physician) and Advanced Practice Providers (APPs -  Physician Assistants and Nurse Practitioners) who all work together to provide you with the care you need, when you need it.  We recommend signing up for the patient portal called "MyChart".  Sign up information is provided on this After Visit Summary.  MyChart is used to connect with patients for Virtual Visits (Telemedicine).  Patients are able to view lab/test results, encounter notes, upcoming appointments, etc.  Non-urgent messages can be sent to your provider as well.   To learn more about what you can do with MyChart, go to [email or web address].    Your next appointment:   Follow up after testing   The format for your next appointment:   In Person  Provider:   You may  see Kate Sable, MD or one of the following Advanced Practice Providers on your designated Care Team:   Murray Hodgkins, NP Christell Faith, PA-C Cadence Kathlen Mody, [place]    Other Instructions

## 2021-11-13 ENCOUNTER — Telehealth (INDEPENDENT_AMBULATORY_CARE_PROVIDER_SITE_OTHER): Payer: Self-pay

## 2021-11-13 NOTE — Telephone Encounter (Signed)
I attempted to contact the patient to let her know that she is scheduled for her AAA repair on 12/04/21 with Dr. Lucky Cowboy at [facility] with a 6:15 am arrival time. Pre-op phone call is on 11/25/21 between 8-1 pm and covid testing is on 12/02/21 between 8-12 pm at the [facility]. Pre-surgical instructions will be mailed as well.

## 2021-11-14 ENCOUNTER — Other Ambulatory Visit: Payer: Self-pay

## 2021-11-14 ENCOUNTER — Encounter
Admission: RE | Admit: 2021-11-14 | Discharge: 2021-11-14 | Disposition: A | Discharge status: Home / Self Care | Payer: Medicare Other | Source: Ambulatory Visit | Attending: Cardiology | Admitting: Cardiology

## 2021-11-14 DIAGNOSIS — Z01810 Encounter for preprocedural cardiovascular examination: Secondary | ICD-10-CM | POA: Diagnosis not present

## 2021-11-14 MED ORDER — TECHNETIUM TC 99M TETROFOSMIN IV KIT
10.0000 | PACK | Freq: Once | INTRAVENOUS | Status: AC
  Administered 2021-11-14: 9.13 via INTRAVENOUS

## 2021-11-14 MED ORDER — REGADENOSON 0.4 MG/5ML IV SOLN
0.4000 mg | Freq: Once | INTRAVENOUS | Status: AC
  Administered 2021-11-14: 0.4 mg via INTRAVENOUS

## 2021-11-14 MED ORDER — TECHNETIUM TC 99M TETROFOSMIN IV KIT
30.0000 | PACK | Freq: Once | INTRAVENOUS | Status: AC | PRN
  Administered 2021-11-14: 31.7 via INTRAVENOUS

## 2021-11-18 ENCOUNTER — Ambulatory Visit (INDEPENDENT_AMBULATORY_CARE_PROVIDER_SITE_OTHER): Payer: Medicare Other

## 2021-11-18 ENCOUNTER — Other Ambulatory Visit: Payer: Self-pay

## 2021-11-18 DIAGNOSIS — Z01810 Encounter for preprocedural cardiovascular examination: Secondary | ICD-10-CM

## 2021-11-18 LAB — NM MYOCAR MULTI W/SPECT W/WALL MOTION / EF
LV dias vol: 26 mL (ref 46–106)
LV sys vol: 3 mL
Nuc Stress EF: 88 %
Peak HR: 102 {beats}/min
Percent HR: 71 %
Rest HR: 75 {beats}/min
Rest Nuclear Isotope Dose: 9.1 mCi
SDS: 0
SRS: 7
SSS: 0
ST Depression (mm): 0 mm
Stress Nuclear Isotope Dose: 31.7 mCi
TID: 0.95

## 2021-11-18 LAB — ECHOCARDIOGRAM COMPLETE
AR max vel: 4.1 cm2
AV Area VTI: 4.02 cm2
AV Area mean vel: 4.06 cm2
AV Mean grad: 4 mmHg
AV Peak grad: 6.3 mmHg
Ao pk vel: 1.25 m/s
Area-P 1/2: 2.39 cm2
Calc EF: 67.1 %
S' Lateral: 2.1 cm
Single Plane A2C EF: 67.7 %
Single Plane A4C EF: 65.4 %

## 2021-11-25 ENCOUNTER — Other Ambulatory Visit (INDEPENDENT_AMBULATORY_CARE_PROVIDER_SITE_OTHER): Payer: Self-pay | Admitting: Nurse Practitioner

## 2021-11-25 ENCOUNTER — Other Ambulatory Visit
Admission: RE | Admit: 2021-11-25 | Discharge: 2021-11-25 | Disposition: A | Discharge status: Home / Self Care | Payer: Medicare Other | Source: Ambulatory Visit | Attending: Vascular Surgery | Admitting: Vascular Surgery

## 2021-11-25 ENCOUNTER — Other Ambulatory Visit: Payer: Self-pay

## 2021-11-25 ENCOUNTER — Telehealth (INDEPENDENT_AMBULATORY_CARE_PROVIDER_SITE_OTHER): Payer: Self-pay

## 2021-11-25 DIAGNOSIS — I7143 Infrarenal abdominal aortic aneurysm, without rupture: Secondary | ICD-10-CM

## 2021-11-25 HISTORY — DX: Depression, unspecified: F32.A

## 2021-11-25 HISTORY — DX: Essential (primary) hypertension: I10

## 2021-11-25 HISTORY — DX: Cardiac murmur, unspecified: R01.1

## 2021-11-25 HISTORY — DX: Anemia, unspecified: D64.9

## 2021-11-25 HISTORY — DX: Hypothyroidism, unspecified: E03.9

## 2021-11-25 HISTORY — DX: Personal history of urinary calculi: Z87.442

## 2021-11-25 NOTE — Patient Instructions (Addendum)
Your procedure is scheduled on: Wednesday December 04, 2021. Report to Day Surgery inside [facility] 2nd floor, stop by admissions desk before getting on elevator.  To find out your arrival time please call [phone] between 1PM - 3PM on Tuesday December 03, 2021.  Remember: Instructions that are not followed completely may result in serious medical risk,  up to and including death, or upon the discretion of your surgeon and anesthesiologist your  surgery may need to be rescheduled.     _X__ 1. Do not eat food or drink fluids after midnight the night before your procedure.                 No chewing gum or hard candies.   __X__2.  On the morning of surgery brush your teeth with toothpaste and water, you                may rinse your mouth with mouthwash if you wish.  Do not swallow any toothpaste or mouthwash.     _X__ 3.  No Alcohol for 24 hours before or after surgery.   _X__ 4.  Do Not Smoke or use e-cigarettes For 24 Hours Prior to Your Surgery.                 Do not use any chewable tobacco products for at least 6 hours prior to                 Surgery.  _X__  5.  Do not use any recreational drugs (marijuana, cocaine, heroin, ecstasy, MDMA or other)                For at least one week prior to your surgery.  Combination of these drugs with anesthesia                May have life threatening results.  ____  6.  Bring all medications with you on the day of surgery if instructed.   __X__  7.  Notify your doctor if there is any change in your medical condition      (cold, fever, infections).     Do not wear jewelry, make-up, hairpins, clips or nail polish. Do not wear lotions, powders, or perfumes. You may wear deodorant. Do not shave 48 hours prior to surgery. Men may shave face and neck. Do not bring valuables to the hospital.    [facility] is not responsible for any belongings or valuables.  Contacts, dentures or bridgework may not be worn into  surgery. Leave your suitcase in the car. After surgery it may be brought to your room. For patients admitted to the hospital, discharge time is determined by your treatment team.   Patients discharged the day of surgery will not be allowed to drive home.   Make arrangements for someone to be with you for the first 24 hours of your Same Day Discharge.   __X__ Take these medicines the morning of surgery with A SIP OF WATER:    1. levothyroxine (SYNTHROID) 75 MCG  2.    3.  4.  5.  6.  ____ Fleet Enema (as directed)   __X__ Use CHG Soap (or wipes) as directed  ____ Use Benzoyl Peroxide Gel as instructed  ____ Use inhalers on the day of surgery  ____ Stop metformin 2 days prior to surgery    ____ Take 1/2 of usual insulin dose the night before surgery. No insulin the morning  of surgery.   __X__ Stop  cilostazol (PLETAL) 50 MG  today until your surgery.   __X__ One Week prior to surgery- Stop Anti-inflammatories such as Ibuprofen, Aleve, Advil, Motrin, meloxicam (MOBIC), diclofenac, etodolac, ketorolac, Toradol, Daypro, piroxicam, Goody's or BC powders. OK TO USE TYLENOL IF NEEDED   __X__ Stop supplements until after surgery. ascorbic acid (VITAMIN C) 1000 MG   ____ Bring C-Pap to the hospital.    If you have any questions regarding your pre-procedure instructions,  Please call Pre-admit Testing at [phone]

## 2021-11-25 NOTE — Telephone Encounter (Signed)
Patient requested a return call about her pre-op phone call and if they were going to call or was I. I called the patient and let her know that pre-admission will be calling her

## 2021-11-25 NOTE — Pre-Procedure Instructions (Signed)
Spoke with Patient for PAT appointment. Patient wanted to know if Dr. Driscilla Grammes office have received the images from [facility] for the AAA. Sent message to [facility] and Dr. Wyn Quaker stated that he is not sure if they are at the office, advise patient to call office to verify.

## 2021-11-27 ENCOUNTER — Other Ambulatory Visit
Admission: RE | Admit: 2021-11-27 | Discharge: 2021-11-27 | Disposition: A | Discharge status: Home / Self Care | Payer: Medicare Other | Source: Ambulatory Visit | Attending: Vascular Surgery | Admitting: Vascular Surgery

## 2021-11-27 ENCOUNTER — Other Ambulatory Visit: Payer: Self-pay

## 2021-11-27 DIAGNOSIS — I7143 Infrarenal abdominal aortic aneurysm, without rupture: Secondary | ICD-10-CM | POA: Insufficient documentation

## 2021-11-27 LAB — CBC WITH DIFFERENTIAL/PLATELET
Abs Immature Granulocytes: 0.04 10*3/uL (ref 0.00–0.07)
Basophils Absolute: 0 10*3/uL (ref 0.0–0.1)
Basophils Relative: 1 %
Eosinophils Absolute: 0.2 10*3/uL (ref 0.0–0.5)
Eosinophils Relative: 3 %
HCT: 35.1 % — ABNORMAL LOW (ref 36.0–46.0)
Hemoglobin: 11.7 g/dL — ABNORMAL LOW (ref 12.0–15.0)
Immature Granulocytes: 1 %
Lymphocytes Relative: 25 %
Lymphs Abs: 1.7 10*3/uL (ref 0.7–4.0)
MCH: 30.7 pg (ref 26.0–34.0)
MCHC: 33.3 g/dL (ref 30.0–36.0)
MCV: 92.1 fL (ref 80.0–100.0)
Monocytes Absolute: 0.8 10*3/uL (ref 0.1–1.0)
Monocytes Relative: 12 %
Neutro Abs: 4.1 10*3/uL (ref 1.7–7.7)
Neutrophils Relative %: 58 %
Platelets: 287 10*3/uL (ref 150–400)
RBC: 3.81 MIL/uL — ABNORMAL LOW (ref 3.87–5.11)
RDW: 15 % (ref 11.5–15.5)
WBC: 6.9 10*3/uL (ref 4.0–10.5)
nRBC: 0 % (ref 0.0–0.2)

## 2021-11-27 LAB — BASIC METABOLIC PANEL
Anion gap: 8 (ref 5–15)
BUN: 17 mg/dL (ref 8–23)
CO2: 23 mmol/L (ref 22–32)
Calcium: 9.1 mg/dL (ref 8.9–10.3)
Chloride: 99 mmol/L (ref 98–111)
Creatinine, Ser: 0.73 mg/dL (ref 0.44–1.00)
GFR, Estimated: >60 mL/min (ref >60)
Glucose, Bld: 80 mg/dL (ref 70–99)
Potassium: 3.9 mmol/L (ref 3.5–5.1)
Sodium: 130 mmol/L — ABNORMAL LOW (ref 135–145)

## 2021-11-28 ENCOUNTER — Encounter: Payer: Self-pay | Admitting: Vascular Surgery

## 2021-11-28 NOTE — Telephone Encounter (Signed)
Patient has been called and given her new arrival time to the [facility] on 12/04/21 for her AAA surgery.

## 2021-11-28 NOTE — Progress Notes (Signed)
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 Clinical nursing staff has updated patient's PMH/PSHx, current medication list, and drug allergies/intolerances to ensure comprehensive history available to assist in medical decision making as it pertains to the aforementioned surgical procedure and anticipated anesthetic course. Extensive review of available clinical information performed. [place] PMH and PSHx updated with any diagnoses/procedures that  may have been inadvertently omitted during her intake with the pre-admission testing department's nursing staff.  Clinical Discussion:  Tracy Parsons is a 76 y.o. female who is submitted for pre-surgical anesthesia review and clearance prior to her undergoing the above procedure. Patient is a Current Smoker.. Pertinent PMH includes: CAD, PAD (infrarenal AAA, CTO of BILATERAL internal iliac arteries, CTO of the RIGHT common iliac artery, LEFT external iliac artery aneurysm, proximal celiac artery aneurysm), diastolic dysfunction, cardiac murmur, aortic atherosclerosis, intermittent claudication, HTN, HLD, hypothyroidism, traumatic subdural hematoma, normal pressure hydrocephalus (s/p VP shunt), anemia, OA, depression.  Patient is followed by cardiology Azucena Cecil(Agbor-Etang, MD). She was last seen in the cardiology clinic on 11/08/2021; notes reviewed.  At the time of her clinic visit, the patient denied any chest pain, shortness of breath, PND, orthopnea, palpitations, significant peripheral edema, vertiginous symptoms, or presyncope/syncope.  Patient with a PMH significant for cardiovascular diagnoses.  TTE performed on 12/27/2020 revealed a normal left ventricular systolic dysfunction with a hyperdynamic LVEF of >65%. Transaortic valve gradient with Valsalva was 30 mmHg, which is felt to be secondary to chordal systolic anterior motion (SAM). Aortic valve opens freely; mild aortic sclerosis of the aortic valve cusps. There is no evidence of valvular regurgitation or stenosis.   Regadenoson myocardial perfusion imaging study performed on 12/31/2020 revealed a normal left ventricular systolic function with an EF of 79%.  There was no evidence of stress-induced myocardial ischemia or arrhythmia.  Study determined to be normal and low risk.  Patient with a history of significant PVD.  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  Given patient's extensive PVD, patient is on daily dual antiplatelet therapies using low-dose ASA and cilostazol.  Patient currently being followed by vascular surgery for consideration of AAA repair.  Patient is compliant with therapies with no evidence or reports of GI bleeding.  Blood pressure well controlled at 110/70 on currently prescribed ACEi monotherapy.  Patient is on a statin for her HLD and ASCVD prevention.  She is not diabetic.  Functional capacity somewhat limited by intermittent claudication, however patient  still felt to be able to  achieve at least 4 METS of activity with no angina/anginal equivalent symptoms.  No changes were made to her medication regimen.  Given high risk nature of vascular surgery, cardiology electing to pursue repeat myocardial perfusion imaging study and TTE to evaluate for potential ischemia prior to proceeding with the planned surgical intervention.  Patient to follow-up with outpatient cardiology following cardiovascular testing.  Since patient was seen by local cardiology, patient has undergone the recommended cardiovascular testing.  Myocardial perfusion imaging study performed on 11/14/2021 revealed a normal left ventricular systolic function with an EF of >65%.  CT attenuation correction images demonstrated moderate aortic atherosclerosis, coronary calcification predominantly in the LAD and LCx regions.  There was no evidence of significant ischemia.  Study determined to be low risk.  TTE performed on 11/18/2021 revealed a normal left ventricular systolic function with an EF of 60-65%.  Diastolic parameters consistent with impaired relaxation (G1DD).  Mitral valve noted to be degenerative with trivial regurgitation and moderate calcification.  There was no evidence of mitral valve stenosis.  There was no evidence of aortic valve regurgitation or a significant transvalvular gradient to suggest stenosis.  [name] is scheduled for an ENDOVASCULAR REPAIR/STENT GRAFT on 12/04/2021 with Dr. Festus Barren, MD.  Given patient's past medical history significant for cardiovascular diagnoses, presurgical cardiac clearance was sought by the PAT team.  Per cardiology, "vascular surgery is usually deemed high risk from a cardiovascular perspective.  ECG in the office showing a possible old inferior infarct.  Patient denies any chest pain or shortness of breath.  Echocardiogram and Lexiscan Myoview both normal.  Patient may proceed with planned surgical intervention at an overall ACCEPTABLE  risk of significant perioperative cardiovascular complications".  Again, this patient is on daily DAPT therapy.  She has been instructed on recommendations from her vascular surgeon for holding her cilostazol prior to her procedure.  Patient was advised that her last dose would be on 11/24/2021.  She will continue her daily low-dose ASA throughout the perioperative course.  Patient denies previous perioperative complications with anesthesia in the past. In review of the available records, it is noted that patient underwent a general anesthetic course at [facility] (ASA IV) in 06/2021 without documented complications.   Vitals with BMI 11/25/2021 11/08/2021 11/05/2021  Height 5\' 0"  5\' 0"  5\' 0"   Weight 108 lbs 108 lbs 6 oz 108 lbs  BMI 21.09 21.17 21.09  Systolic - 110 122  Diastolic - 70 73  Pulse - 83 96    Providers/Specialists:   NOTE: Primary physician provider listed below. Patient may have been seen by APP or partner within same practice.   PROVIDER ROLE / SPECIALTY LAST Shanna Cisco, MD Vascular Surgery (Surgeon) 11/05/2021  Villa Herb, MD Primary Care Provider ???  Debbe Odea, MD Cardiology 11/08/2021   Allergies:  Lipitor [atorvastatin] and Sulfa antibiotics  Current Home Medications:   No current facility-administered medications for this encounter.    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   Aortic atherosclerosis (HCC)    Atherosclerosis of native arteries of extremity with intermittent  claudication (HCC)    CAD (coronary artery disease)    Depression    Diastolic dysfunction    a.) TTE 11/18/2021: EF 60-65%; triv MR; G1DD   Heart murmur    History of kidney stones    HLD (hyperlipidemia)    Hypertension    Hypothyroidism    Infrarenal abdominal aortic aneurysm (AAA) without rupture    a.) CTA 09/16/2021: infrarenal AAA with mural thrombus measuring 5.2 x 3.6 cm   Long term current use of antithrombotics/antiplatelets    a.) ASA + cilostazol   Normal pressure hydrocephalus (HCC)    a.) VP shunt in place   Osteoarthritis    PAD (peripheral artery disease) (HCC)    a.) CTA 09/16/2021: 5.2 x 3.6 cm infrarenal AAA, chronic CTO of BILATERAL interal iliac arteries; CTO RIGHT common iliac artery; 5.1 x 2.3 cm LEFT external iliac artery aneurysm; Tandem of 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Past Surgical History:  Procedure Laterality Date   ABDOMINAL HYSTERECTOMY     EYE SURGERY Bilateral    NASAL SINUS SURGERY  1970   shunt in head  2022   No family history on file. Social History   Tobacco Use   Smoking status: Every Day    Packs/day: 0.25    Types: Cigarettes   Smokeless tobacco: Never  Vaping Use   Vaping Use: Never used  Substance Use Topics   Alcohol use: Yes    Alcohol/week: 1.0 - 2.0 standard drink    Types: 1 - 2 Glasses of wine per week   Drug use: Never    Pertinent Clinical Results:  LABS: Labs reviewed: Acceptable for surgery.  Hospital Outpatient Visit on 11/27/2021  Component Date Value Ref Range Status   WBC 11/27/2021 6.9  4.0 - 10.5 K/uL Final   RBC 11/27/2021 3.81 (L)  3.87 - 5.11 MIL/uL Final   Hemoglobin 11/27/2021 11.7 (L)  12.0 - 15.0 g/dL Final   HCT 32/99/242602/22/2023 35.1 (L)  36.0 - 46.0 % Final   MCV 11/27/2021 92.1  80.0 - 100.0 fL Final   MCH 11/27/2021 30.7  26.0 - 34.0 pg Final   MCHC 11/27/2021 33.3  30.0 - 36.0 g/dL Final   RDW  83/41/962202/22/2023 15.0  11.5 - 15.5 % Final   Platelets 11/27/2021 287  150 - 400 K/uL Final   nRBC 11/27/2021 0.0  0.0 - 0.2 % Final   Neutrophils Relative % 11/27/2021 58  % Final   Neutro Abs 11/27/2021 4.1  1.7 - 7.7 K/uL Final   Lymphocytes Relative 11/27/2021 25  % Final   Lymphs Abs 11/27/2021 1.7  0.7 - 4.0 K/uL Final   Monocytes Relative 11/27/2021 12  % Final   Monocytes Absolute 11/27/2021 0.8  0.1 - 1.0 K/uL Final   Eosinophils Relative 11/27/2021 3  % Final   Eosinophils Absolute 11/27/2021 0.2  0.0 - 0.5 K/uL Final   Basophils Relative 11/27/2021 1  % Final   Basophils Absolute 11/27/2021 0.0  0.0 - 0.1 K/uL Final   Immature Granulocytes 11/27/2021 1  % Final   Abs Immature Granulocytes 11/27/2021 0.04  0.00 - 0.07 K/uL Final   Performed at [facility], [address] [place], [place] [address]   Sodium 11/27/2021 130 (L)  135 - 145  mmol/L Final   Potassium 11/27/2021 3.9  3.5 - 5.1 mmol/L Final   Chloride 11/27/2021 99  98 - 111 mmol/L Final   CO2 11/27/2021 23  22 - 32 mmol/L Final   Glucose, Bld 11/27/2021 80  70 - 99 mg/dL Final   Glucose reference range applies only to samples taken after fasting for at least 8 hours.   BUN 11/27/2021 17  8 - 23 mg/dL Final   Creatinine, Ser 11/27/2021 0.73  0.44 - 1.00 mg/dL Final   Calcium 74/09/8785 9.1  8.9 - 10.3 mg/dL Final   GFR, Estimated 11/27/2021 >60  >60 mL/min Final   Comment: (NOTE) Calculated using the CKD-EPI Creatinine Equation (2021)    Anion gap 11/27/2021 8  5 - 15 Final   Performed at [facility], [address] [place], [place] [address]   ABO/RH(D) 11/27/2021 O POS   Final   Antibody Screen 11/27/2021 NEG   Final   Sample Expiration 11/27/2021 12/11/2021,2359   Final   Extend sample reason 11/27/2021    Final                   Value:NO TRANSFUSIONS OR PREGNANCY IN THE PAST 3 MONTHS Performed at [facility], [address] [place], [place] [address]     ECG: Date:  11/08/2021 Time ECG obtained: 1106 AM Rate: 83 bpm Rhythm: normal sinus Axis (leads I and aVF): Left axis deviation Intervals: PR 152 ms. QRS 66 ms. QTc 420 ms. ST segment and T wave changes: No evidence of acute ST segment elevation or depression.  Evidence of an age undetermined inferior infarct present. Comparison: Similar to previous tracing obtained on 07/02/2021   IMAGING / PROCEDURES: TRANSTHORACIC ECHOCARDIOGRAM performed on 11/18/2021 Left ventricular ejection fraction, by estimation, is 60 to 65%. Left ventricular ejection fraction by 2D MOD biplane is 67.1 %. The left ventricle has normal function. The left ventricle has no regional wall motion abnormalities. Left ventricular diastolic parameters are consistent with Grade I diastolic dysfunction (impaired relaxation).  Right ventricular systolic function is normal. The right ventricular size is normal.  The mitral valve is degenerative. Trivial mitral valve regurgitation. Moderate mitral annular calcification.  The aortic valve was not well visualized. Aortic valve regurgitation is not visualized. Aortic valve sclerosis/calcification is present, without any evidence of aortic stenosis.  The inferior vena cava is normal in size with <50% respiratory variability, suggesting right atrial pressure of 8 mmHg.   MYOCARDIAL PERFUSION IMAGING STUDY (LEXISCAN) performed on 11/14/2021 LVEF >65% Normal wall motion CT attenuation correction images with moderate aortic atherosclerosis and coronary calcifications predominantly in the LAD and LCx regions No evidence of significant myocardial ischemia or arrhythmia Study determined to be low risk  CTA EVAR PRE WITH IV CONTRAST performed on 09/16/2021 Fusiform infrarenal abdominal aortic aneurysm with mural thrombus measuring up to 5.2 x 3.6 cm. Atheromatous plaque at the level of the renal arteries posteriorly but with otherwise normal caliber aorta at this level.  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  Chronic total occlusion of the bilateral internal iliac arteries with filling of the branches via hypertrophied ascending lumbar arteries.  Small caliber LEFT external iliac artery 5.1 x 2.3 mm.  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  Duplicated RIGHT renal arteries.  Degenerative changes of the spine as noted with chronic degenerative L4 on L5-S1 grade 1 anterolisthesis.  Ventriculoperitoneal shunt in place.    ABI/PVR COMPLETE performed on 09/05/2021 Right: ABI suggests moderate LE arterial disease Left: ABI suggest mild LE arterial disease Mild improvement compared to previous study  ABDOMINAL AORTA DUPLEX SCAN performed on 09/05/2021 Large aneurysm involving the infrarenal aorta Abdominal aorta: There is an aneurysm Partially thrombosed abdominal aneurysm noted Unable to visualize BILATERAL CIA's due to acoustic shadowing from abdominal gas  Impression and Plan:  Aubrina Nieman has been referred for pre-anesthesia review and clearance prior to her undergoing the planned anesthetic and procedural courses. Available labs, pertinent testing, and imaging results were personally reviewed by me. This patient has been appropriately cleared by cardiology with an overall ACCEPTABLE risk of significant perioperative cardiovascular complications.  Based on clinical review performed today (11/28/21), barring any significant acute changes in the patient's overall condition, it is anticipated that she will be able to proceed with the planned surgical intervention. Any acute changes in clinical condition may necessitate her procedure being postponed and/or cancelled. Patient will meet with anesthesia team (MD and/or CRNA) on the day of her  procedure for preoperative evaluation/assessment. Questions regarding anesthetic course will be fielded at that time.   Pre-surgical instructions were reviewed with the patient during her PAT appointment and questions were fielded by PAT clinical staff. Patient was advised that if any questions or concerns arise prior to her procedure then she should return a call to PAT and/or her surgeon's office to discuss.  Quentin Mulling, MSN, APRN, FNP-C, CEN [facility]  Peri-operative Services Nurse Practitioner Phone: [phone] Fax: [phone] 11/28/21 10:21 AM  NOTE: This note has been prepared using Dragon dictation software. Despite my best ability to proofread, there is always the potential that unintentional transcriptional errors may still occur from this process.

## 2021-11-29 ENCOUNTER — Other Ambulatory Visit: Payer: Medicare Other

## 2021-12-02 ENCOUNTER — Other Ambulatory Visit: Payer: Self-pay

## 2021-12-02 ENCOUNTER — Other Ambulatory Visit
Admission: RE | Admit: 2021-12-02 | Discharge: 2021-12-02 | Disposition: A | Discharge status: Home / Self Care | Payer: Medicare Other | Source: Ambulatory Visit | Attending: Vascular Surgery | Admitting: Vascular Surgery

## 2021-12-02 DIAGNOSIS — Z20822 Contact with and (suspected) exposure to covid-19: Secondary | ICD-10-CM

## 2021-12-02 DIAGNOSIS — Z01812 Encounter for preprocedural laboratory examination: Secondary | ICD-10-CM | POA: Insufficient documentation

## 2021-12-02 LAB — SARS CORONAVIRUS 2 (TAT 6-24 HRS): SARS Coronavirus 2: NEGATIVE

## 2021-12-03 NOTE — Anesthesia Preprocedure Evaluation (Addendum)
Anesthesia Evaluation  Patient identified by MRN, date of birth, ID band Patient awake    Reviewed: Allergy & Precautions, NPO status , Patient's Chart, lab work & pertinent test results  History of Anesthesia Complications Negative for: history of anesthetic complications  Airway Mallampati: III   Neck ROM: Full    Dental  (+) Upper Dentures, Lower Dentures   Pulmonary Current Smoker (4 cigarettes per day) and Patient abstained from smoking.,    Pulmonary exam normal breath sounds clear to auscultation       Cardiovascular hypertension, + CAD and + Peripheral Vascular Disease (infrarenal AAA, 5.5 cm)  Normal cardiovascular exam Rhythm:Regular Rate:Normal  TTE 11/18/21: normal left ventricular systolic function with an EF of 60-65%.  Diastolic parameters consistent with impaired relaxation (G1DD).  Mitral valve noted to be degenerative with trivial regurgitation and moderate calcification.  There was no evidence of mitral valve stenosis.  There was no evidence of aortic valve regurgitation or a significant transvalvular gradient to suggest stenosis.  Myocardial perfusion 11/14/21:  Pharmacological myocardial perfusion imaging study with no significant  ischemia Normal wall motion, EF estimated at >65% No EKG changes concerning for ischemia at peak stress or in recovery. CT attenuation correction images with moderate aortic atherosclerosis, coronary calcification predominantly LAD, left circumflex Low risk scan  ECG 11/08/21: normal sinus rhythm, possible old inferior infarct   Neuro/Psych PSYCHIATRIC DISORDERS Depression Normal pressure hydrocephalus s/p VP shunt; traumatic SDH s/p craniotomy evacuation 07/02/21    GI/Hepatic negative GI ROS,   Endo/Other  Hypothyroidism   Renal/GU Renal disease (nephrolithiasis)     Musculoskeletal   Abdominal   Peds  Hematology  (+) Blood dyscrasia, anemia ,   Anesthesia Other  Findings Reviewed and agree with Edd Fabian pre-anesthesia clinical review note.  Cardiology note 11/08/21:  1. Preop evaluation prior to vascular surgery.  Vascular surgery usually deemed high risk from a cardiac perspective.  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  Patient denies chest pain or shortness of breath.  Get echocardiogram to evaluate any structural abnormalities, get Lexiscan Myoview to evaluate presence of ischemia.  If no significant findings on echo or Lexiscan, okay for procedure. 2. AAA, right iliac artery occlusion.  Continue aspirin, statin.  Follows with vascular surgery. 3. Hypertension, BP controlled.  Continue lisinopril 20 mg daily. 4. Hyperlipidemia, obtain fasting lipid profile from PCP.  Continue lovastatin.  Could not tolerate Lipitor in the past. 5. Current smoker, cessation advised.  Follow-up after cardiac testing.  Reproductive/Obstetrics                            Anesthesia Physical Anesthesia Plan  ASA: 4  Anesthesia Plan: General   Post-op Pain Management:    Induction: Intravenous  PONV Risk Score and Plan: 2 and Ondansetron, Dexamethasone and Treatment may vary due to age or medical condition  Airway Management Planned: Oral ETT  Additional Equipment: Arterial line  Intra-op Plan:   Post-operative Plan: Extubation in OR  Informed Consent: I have reviewed the patients History and Physical, chart, labs and discussed the procedure including the risks, benefits and alternatives for the proposed anesthesia with the patient or authorized representative who has indicated his/her understanding and acceptance.     Dental advisory given  Plan Discussed with: CRNA  Anesthesia Plan Comments: (Patient consented for risks of anesthesia including but not limited to:  - adverse reactions to medications - damage to eyes, teeth, lips or other oral mucosa -  nerve damage due to positioning  - sore throat or hoarseness -  damage to heart, brain, nerves, lungs, other parts of body or loss of life  Informed patient about role of CRNA in peri- and intra-operative care.  Patient voiced understanding.)        Anesthesia Quick Evaluation

## 2021-12-04 ENCOUNTER — Encounter
Admission: RE | Disposition: A | Discharge status: Home / Self Care | Payer: Self-pay | Source: Home / Self Care | Attending: Vascular Surgery

## 2021-12-04 ENCOUNTER — Inpatient Hospital Stay: Payer: Medicare Other | Admitting: Urgent Care

## 2021-12-04 ENCOUNTER — Other Ambulatory Visit: Payer: Self-pay

## 2021-12-04 ENCOUNTER — Inpatient Hospital Stay
Admission: RE | Admit: 2021-12-04 | Discharge: 2021-12-07 | DRG: 269 | Disposition: A | Discharge status: Home / Self Care | Payer: Medicare Other | Attending: Vascular Surgery | Admitting: Vascular Surgery

## 2021-12-04 ENCOUNTER — Encounter: Payer: Self-pay | Admitting: Vascular Surgery

## 2021-12-04 DIAGNOSIS — I745 Embolism and thrombosis of iliac artery: Secondary | ICD-10-CM | POA: Diagnosis present

## 2021-12-04 DIAGNOSIS — I714 Abdominal aortic aneurysm, without rupture, unspecified: Principal | ICD-10-CM | POA: Diagnosis present

## 2021-12-04 DIAGNOSIS — I70221 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, right leg: Secondary | ICD-10-CM | POA: Diagnosis not present

## 2021-12-04 DIAGNOSIS — Z87442 Personal history of urinary calculi: Secondary | ICD-10-CM | POA: Diagnosis not present

## 2021-12-04 DIAGNOSIS — Z20822 Contact with and (suspected) exposure to covid-19: Secondary | ICD-10-CM | POA: Diagnosis present

## 2021-12-04 DIAGNOSIS — E039 Hypothyroidism, unspecified: Secondary | ICD-10-CM | POA: Diagnosis present

## 2021-12-04 DIAGNOSIS — G8929 Other chronic pain: Secondary | ICD-10-CM | POA: Diagnosis present

## 2021-12-04 DIAGNOSIS — F17210 Nicotine dependence, cigarettes, uncomplicated: Secondary | ICD-10-CM | POA: Diagnosis present

## 2021-12-04 DIAGNOSIS — Z90710 Acquired absence of both cervix and uterus: Secondary | ICD-10-CM

## 2021-12-04 DIAGNOSIS — Z982 Presence of cerebrospinal fluid drainage device: Secondary | ICD-10-CM | POA: Diagnosis not present

## 2021-12-04 DIAGNOSIS — I2510 Atherosclerotic heart disease of native coronary artery without angina pectoris: Secondary | ICD-10-CM | POA: Diagnosis present

## 2021-12-04 DIAGNOSIS — D649 Anemia, unspecified: Secondary | ICD-10-CM | POA: Diagnosis present

## 2021-12-04 DIAGNOSIS — I70211 Atherosclerosis of native arteries of extremities with intermittent claudication, right leg: Secondary | ICD-10-CM | POA: Diagnosis present

## 2021-12-04 DIAGNOSIS — I10 Essential (primary) hypertension: Secondary | ICD-10-CM | POA: Diagnosis present

## 2021-12-04 DIAGNOSIS — E7800 Pure hypercholesterolemia, unspecified: Secondary | ICD-10-CM | POA: Diagnosis present

## 2021-12-04 HISTORY — DX: Traumatic subdural hemorrhage with loss of consciousness status unknown, initial encounter: S06.5XAA

## 2021-12-04 HISTORY — DX: Atherosclerotic heart disease of native coronary artery without angina pectoris: I25.10

## 2021-12-04 HISTORY — DX: Infrarenal abdominal aortic aneurysm, without rupture: I71.43

## 2021-12-04 HISTORY — DX: Atherosclerosis of aorta: I70.0

## 2021-12-04 HISTORY — DX: Atherosclerosis of native arteries of extremities with intermittent claudication, unspecified extremity: I70.219

## 2021-12-04 HISTORY — DX: Long term (current) use of antithrombotics/antiplatelets: Z79.02

## 2021-12-04 HISTORY — DX: Other ill-defined heart diseases: I51.89

## 2021-12-04 HISTORY — DX: (Idiopathic) normal pressure hydrocephalus: G91.2

## 2021-12-04 HISTORY — DX: Unspecified osteoarthritis, unspecified site: M19.90

## 2021-12-04 HISTORY — DX: Hyperlipidemia, unspecified: E78.5

## 2021-12-04 HISTORY — PX: ENDOVASCULAR STENT GRAFT (AAA): CATH118280

## 2021-12-04 HISTORY — DX: Peripheral vascular disease, unspecified: I73.9

## 2021-12-04 LAB — CBC
HCT: 26.1 % — ABNORMAL LOW (ref 36.0–46.0)
Hemoglobin: 8.7 g/dL — ABNORMAL LOW (ref 12.0–15.0)
MCH: 30.1 pg (ref 26.0–34.0)
MCHC: 33.3 g/dL (ref 30.0–36.0)
MCV: 90.3 fL (ref 80.0–100.0)
Platelets: 148 10*3/uL — ABNORMAL LOW (ref 150–400)
RBC: 2.89 MIL/uL — ABNORMAL LOW (ref 3.87–5.11)
RDW: 14.5 % (ref 11.5–15.5)
WBC: 13.4 10*3/uL — ABNORMAL HIGH (ref 4.0–10.5)
nRBC: 0 % (ref 0.0–0.2)

## 2021-12-04 LAB — BASIC METABOLIC PANEL
Anion gap: 10 (ref 5–15)
BUN: 16 mg/dL (ref 8–23)
CO2: 19 mmol/L — ABNORMAL LOW (ref 22–32)
Calcium: 7.4 mg/dL — ABNORMAL LOW (ref 8.9–10.3)
Chloride: 107 mmol/L (ref 98–111)
Creatinine, Ser: 0.8 mg/dL (ref 0.44–1.00)
GFR, Estimated: >60 mL/min (ref >60)
Glucose, Bld: 202 mg/dL — ABNORMAL HIGH (ref 70–99)
Potassium: 3.6 mmol/L (ref 3.5–5.1)
Sodium: 136 mmol/L (ref 135–145)

## 2021-12-04 LAB — MRSA NEXT GEN BY PCR, NASAL: MRSA by PCR Next Gen: NOT DETECTED

## 2021-12-04 LAB — ABO/RH: ABO/RH(D): O POS

## 2021-12-04 LAB — PREPARE RBC (CROSSMATCH)

## 2021-12-04 LAB — GLUCOSE, CAPILLARY: Glucose-Capillary: 168 mg/dL — ABNORMAL HIGH (ref 70–99)

## 2021-12-04 SURGERY — ENDOVASCULAR STENT GRAFT (AAA)
Anesthesia: General

## 2021-12-04 MED ORDER — ACETAMINOPHEN 325 MG PO TABS
325.0000 mg | ORAL_TABLET | ORAL | Status: DC | PRN
  Administered 2021-12-04 – 2021-12-07 (×8): 650 mg via ORAL
  Filled 2021-12-04 (×7): qty 2

## 2021-12-04 MED ORDER — SODIUM CHLORIDE 0.9 % IV SOLN: INTRAVENOUS | Status: DC | PRN

## 2021-12-04 MED ORDER — BUPIVACAINE-EPINEPHRINE (PF) 0.25% -1:200000 IJ SOLN
INTRAMUSCULAR | Status: AC
  Filled 2021-12-04: qty 30

## 2021-12-04 MED ORDER — DOPAMINE-DEXTROSE 3.2-5 MG/ML-% IV SOLN: 3.0000 ug/kg/min | INTRAVENOUS | Status: DC

## 2021-12-04 MED ORDER — MIDAZOLAM HCL 2 MG/2ML IJ SOLN
INTRAMUSCULAR | Status: AC
  Filled 2021-12-04: qty 2

## 2021-12-04 MED ORDER — ONDANSETRON HCL 4 MG/2ML IJ SOLN
INTRAMUSCULAR | Status: AC
  Filled 2021-12-04: qty 4

## 2021-12-04 MED ORDER — ALBUMIN HUMAN 5 % IV SOLN
INTRAVENOUS | Status: AC
  Filled 2021-12-04: qty 500

## 2021-12-04 MED ORDER — ORAL CARE MOUTH RINSE: 15.0000 mL | Freq: Once | OROMUCOSAL | Status: AC

## 2021-12-04 MED ORDER — ONDANSETRON HCL 4 MG/2ML IJ SOLN
INTRAMUSCULAR | Status: DC | PRN
  Administered 2021-12-04: 4 mg via INTRAVENOUS

## 2021-12-04 MED ORDER — DEXMEDETOMIDINE (PRECEDEX) IN NS 20 MCG/5ML (4 MCG/ML) IV SYRINGE
PREFILLED_SYRINGE | INTRAVENOUS | Status: DC | PRN
  Administered 2021-12-04 (×5): 4 ug via INTRAVENOUS

## 2021-12-04 MED ORDER — ACETAMINOPHEN 650 MG RE SUPP: 325.0000 mg | RECTAL | Status: DC | PRN

## 2021-12-04 MED ORDER — SUGAMMADEX SODIUM 200 MG/2ML IV SOLN
INTRAVENOUS | Status: DC | PRN
  Administered 2021-12-04 (×4): 50 mg via INTRAVENOUS

## 2021-12-04 MED ORDER — CEFAZOLIN SODIUM-DEXTROSE 2-4 GM/100ML-% IV SOLN
2.0000 g | Freq: Three times a day (TID) | INTRAVENOUS | Status: AC
  Administered 2021-12-04 – 2021-12-05 (×2): 2 g via INTRAVENOUS
  Filled 2021-12-04 (×3): qty 100

## 2021-12-04 MED ORDER — CHLORHEXIDINE GLUCONATE 0.12 % MT SOLN
15.0000 mL | Freq: Once | OROMUCOSAL | Status: AC
  Administered 2021-12-04: 15 mL via OROMUCOSAL
  Filled 2021-12-04 (×2): qty 15

## 2021-12-04 MED ORDER — CHLORHEXIDINE GLUCONATE CLOTH 2 % EX PADS
6.0000 | MEDICATED_PAD | Freq: Once | CUTANEOUS | Status: AC
  Administered 2021-12-04: 6 via TOPICAL

## 2021-12-04 MED ORDER — POTASSIUM CHLORIDE CRYS ER 20 MEQ PO TBCR: 20.0000 meq | EXTENDED_RELEASE_TABLET | Freq: Every day | ORAL | Status: DC | PRN

## 2021-12-04 MED ORDER — ADULT MULTIVITAMIN W/MINERALS CH
1.0000 | ORAL_TABLET | Freq: Every morning | ORAL | Status: DC
  Administered 2021-12-05 – 2021-12-07 (×3): 1 via ORAL
  Filled 2021-12-04 (×4): qty 1

## 2021-12-04 MED ORDER — FAMOTIDINE IN NACL 20-0.9 MG/50ML-% IV SOLN
20.0000 mg | Freq: Two times a day (BID) | INTRAVENOUS | Status: DC
  Administered 2021-12-04: 20 mg via INTRAVENOUS
  Filled 2021-12-04: qty 50

## 2021-12-04 MED ORDER — EPHEDRINE SULFATE (PRESSORS) 50 MG/ML IJ SOLN
INTRAMUSCULAR | Status: DC | PRN
  Administered 2021-12-04 (×2): 10 mg via INTRAVENOUS
  Administered 2021-12-04: 5 mg via INTRAVENOUS

## 2021-12-04 MED ORDER — LACTATED RINGERS IV SOLN: INTRAVENOUS | Status: DC

## 2021-12-04 MED ORDER — HYDRALAZINE HCL 20 MG/ML IJ SOLN: 5.0000 mg | INTRAMUSCULAR | Status: DC | PRN

## 2021-12-04 MED ORDER — FAMOTIDINE 20 MG PO TABS
20.0000 mg | ORAL_TABLET | Freq: Once | ORAL | Status: AC
Start: 2021-12-04 — End: 2021-12-04
  Administered 2021-12-04: 20 mg via ORAL

## 2021-12-04 MED ORDER — HEPARIN SODIUM (PORCINE) 5000 UNIT/ML IJ SOLN
INTRAMUSCULAR | Status: AC
  Filled 2021-12-04: qty 1

## 2021-12-04 MED ORDER — ASPIRIN EC 81 MG PO TBEC
81.0000 mg | DELAYED_RELEASE_TABLET | Freq: Every day | ORAL | Status: DC
  Administered 2021-12-04 – 2021-12-07 (×4): 81 mg via ORAL
  Filled 2021-12-04 (×4): qty 1

## 2021-12-04 MED ORDER — CALCIUM CITRATE-VITAMIN D3 1000-0.01 MG/30ML PO LIQD
2.0000 | Freq: Every morning | ORAL | Status: DC
Start: 2021-12-05 — End: 2021-12-04

## 2021-12-04 MED ORDER — METOPROLOL TARTRATE 5 MG/5ML IV SOLN
2.0000 mg | INTRAVENOUS | Status: DC | PRN
  Filled 2021-12-04: qty 5

## 2021-12-04 MED ORDER — SERTRALINE HCL 50 MG PO TABS
100.0000 mg | ORAL_TABLET | Freq: Every day | ORAL | Status: DC
Start: 2021-12-04 — End: 2021-12-05
  Administered 2021-12-04 – 2021-12-05 (×2): 100 mg via ORAL
  Filled 2021-12-04 (×2): qty 2

## 2021-12-04 MED ORDER — ACETAMINOPHEN 10 MG/ML IV SOLN: 753.0000 mg | Freq: Once | INTRAVENOUS | Status: DC | PRN

## 2021-12-04 MED ORDER — DOPAMINE-DEXTROSE 3.2-5 MG/ML-% IV SOLN
INTRAVENOUS | Status: AC
  Administered 2021-12-04: 3.265 ug/kg/min via INTRAVENOUS
  Filled 2021-12-04: qty 250

## 2021-12-04 MED ORDER — DOCUSATE SODIUM 100 MG PO CAPS
100.0000 mg | ORAL_CAPSULE | Freq: Every day | ORAL | Status: DC
  Administered 2021-12-05 – 2021-12-07 (×3): 100 mg via ORAL
  Filled 2021-12-04 (×3): qty 1

## 2021-12-04 MED ORDER — PROPOFOL 500 MG/50ML IV EMUL
INTRAVENOUS | Status: AC
  Filled 2021-12-04: qty 50

## 2021-12-04 MED ORDER — OXYCODONE HCL 5 MG PO TABS: 5.0000 mg | ORAL_TABLET | Freq: Once | ORAL | Status: DC | PRN

## 2021-12-04 MED ORDER — PRAVASTATIN SODIUM 20 MG PO TABS
40.0000 mg | ORAL_TABLET | Freq: Every day | ORAL | Status: DC
  Administered 2021-12-04 – 2021-12-06 (×3): 40 mg via ORAL
  Filled 2021-12-04 (×3): qty 2

## 2021-12-04 MED ORDER — GUAIFENESIN-DM 100-10 MG/5ML PO SYRP: 15.0000 mL | ORAL_SOLUTION | ORAL | Status: DC | PRN

## 2021-12-04 MED ORDER — GLYCOPYRROLATE 0.2 MG/ML IJ SOLN
INTRAMUSCULAR | Status: DC | PRN
Start: 2021-12-04 — End: 2021-12-04
  Administered 2021-12-04: .2 mg via INTRAVENOUS

## 2021-12-04 MED ORDER — DEXAMETHASONE SODIUM PHOSPHATE 10 MG/ML IJ SOLN
INTRAMUSCULAR | Status: AC
  Filled 2021-12-04: qty 2

## 2021-12-04 MED ORDER — CLOPIDOGREL BISULFATE 75 MG PO TABS
75.0000 mg | ORAL_TABLET | Freq: Every day | ORAL | Status: DC
  Administered 2021-12-05 – 2021-12-07 (×3): 75 mg via ORAL
  Filled 2021-12-04 (×3): qty 1

## 2021-12-04 MED ORDER — CEFAZOLIN SODIUM 1 G IJ SOLR
INTRAMUSCULAR | Status: AC
  Filled 2021-12-04: qty 20

## 2021-12-04 MED ORDER — NITROGLYCERIN IN D5W 200-5 MCG/ML-% IV SOLN: 5.0000 ug/min | INTRAVENOUS | Status: DC

## 2021-12-04 MED ORDER — VASOPRESSIN 20 UNIT/ML IV SOLN
INTRAVENOUS | Status: DC | PRN
  Administered 2021-12-04 (×5): 2 [IU] via INTRAVENOUS

## 2021-12-04 MED ORDER — KETAMINE HCL 50 MG/5ML IJ SOSY
PREFILLED_SYRINGE | INTRAMUSCULAR | Status: AC
  Filled 2021-12-04: qty 5

## 2021-12-04 MED ORDER — VITAMIN D 25 MCG (1000 UNIT) PO TABS
1000.0000 [IU] | ORAL_TABLET | Freq: Every day | ORAL | Status: DC
  Administered 2021-12-04 – 2021-12-07 (×4): 1000 [IU] via ORAL
  Filled 2021-12-04 (×4): qty 1

## 2021-12-04 MED ORDER — LEVOTHYROXINE SODIUM 50 MCG PO TABS
75.0000 ug | ORAL_TABLET | Freq: Every day | ORAL | Status: DC
  Administered 2021-12-05 – 2021-12-07 (×3): 75 ug via ORAL
  Filled 2021-12-04 (×2): qty 2
  Filled 2021-12-04: qty 1

## 2021-12-04 MED ORDER — KETAMINE HCL 10 MG/ML IJ SOLN
INTRAMUSCULAR | Status: DC | PRN
  Administered 2021-12-04: 20 mg via INTRAVENOUS
  Administered 2021-12-04: 30 mg via INTRAVENOUS

## 2021-12-04 MED ORDER — ROCURONIUM BROMIDE 10 MG/ML (PF) SYRINGE
PREFILLED_SYRINGE | INTRAVENOUS | Status: AC
  Filled 2021-12-04: qty 20

## 2021-12-04 MED ORDER — PHENOL 1.4 % MT LIQD
1.0000 | OROMUCOSAL | Status: DC | PRN
  Filled 2021-12-04: qty 177

## 2021-12-04 MED ORDER — SODIUM CHLORIDE 0.9% IV SOLUTION: Freq: Once | INTRAVENOUS | Status: AC

## 2021-12-04 MED ORDER — FAMOTIDINE 20 MG PO TABS
ORAL_TABLET | ORAL | Status: AC
  Filled 2021-12-04: qty 1

## 2021-12-04 MED ORDER — SODIUM CHLORIDE 0.9 % IV SOLN
500.0000 mL | Freq: Once | INTRAVENOUS | Status: DC | PRN
Start: 2021-12-04 — End: 2021-12-07

## 2021-12-04 MED ORDER — ROCURONIUM BROMIDE 100 MG/10ML IV SOLN
INTRAVENOUS | Status: DC | PRN
  Administered 2021-12-04: 15 mg via INTRAVENOUS
  Administered 2021-12-04: 50 mg via INTRAVENOUS
  Administered 2021-12-04: 10 mg via INTRAVENOUS
  Administered 2021-12-04: 15 mg via INTRAVENOUS
  Administered 2021-12-04 (×2): 20 mg via INTRAVENOUS

## 2021-12-04 MED ORDER — OXYCODONE HCL 5 MG/5ML PO SOLN
5.0000 mg | Freq: Once | ORAL | Status: DC | PRN
  Filled 2021-12-04: qty 5

## 2021-12-04 MED ORDER — PHENYLEPHRINE HCL-NACL 20-0.9 MG/250ML-% IV SOLN
INTRAVENOUS | Status: DC | PRN
  Administered 2021-12-04: 20 ug/min via INTRAVENOUS

## 2021-12-04 MED ORDER — ALBUMIN HUMAN 5 % IV SOLN
12.5000 g | Freq: Once | INTRAVENOUS | Status: AC
  Filled 2021-12-04: qty 250

## 2021-12-04 MED ORDER — PROPOFOL 10 MG/ML IV BOLUS
INTRAVENOUS | Status: DC | PRN
Start: 2021-12-04 — End: 2021-12-04
  Administered 2021-12-04: 100 mg via INTRAVENOUS

## 2021-12-04 MED ORDER — LIDOCAINE HCL (PF) 2 % IJ SOLN
INTRAMUSCULAR | Status: AC
  Filled 2021-12-04: qty 10

## 2021-12-04 MED ORDER — LISINOPRIL 20 MG PO TABS
20.0000 mg | ORAL_TABLET | Freq: Every day | ORAL | Status: DC
  Administered 2021-12-05 – 2021-12-07 (×3): 20 mg via ORAL
  Filled 2021-12-04 (×3): qty 1

## 2021-12-04 MED ORDER — HYDROMORPHONE HCL 1 MG/ML IJ SOLN
1.0000 mg | Freq: Once | INTRAMUSCULAR | Status: AC | PRN
  Administered 2021-12-04: 1 mg via INTRAVENOUS
  Filled 2021-12-04: qty 1

## 2021-12-04 MED ORDER — LABETALOL HCL 5 MG/ML IV SOLN
10.0000 mg | INTRAVENOUS | Status: DC | PRN
  Administered 2021-12-05: 10 mg via INTRAVENOUS
  Filled 2021-12-04: qty 4

## 2021-12-04 MED ORDER — ROCURONIUM BROMIDE 10 MG/ML (PF) SYRINGE
PREFILLED_SYRINGE | INTRAVENOUS | Status: AC
  Filled 2021-12-04: qty 10

## 2021-12-04 MED ORDER — ACETAMINOPHEN 325 MG PO TABS
ORAL_TABLET | ORAL | Status: AC
  Filled 2021-12-04: qty 2

## 2021-12-04 MED ORDER — SODIUM CHLORIDE 0.9 % IV SOLN: INTRAVENOUS | Status: DC

## 2021-12-04 MED ORDER — FENTANYL CITRATE (PF) 100 MCG/2ML IJ SOLN
INTRAMUSCULAR | Status: DC | PRN
  Administered 2021-12-04 (×4): 25 ug via INTRAVENOUS

## 2021-12-04 MED ORDER — SEVOFLURANE IN SOLN
RESPIRATORY_TRACT | Status: AC
  Filled 2021-12-04: qty 250

## 2021-12-04 MED ORDER — VASOPRESSIN 20 UNIT/ML IV SOLN
INTRAVENOUS | Status: AC
  Filled 2021-12-04: qty 1

## 2021-12-04 MED ORDER — DEXMEDETOMIDINE (PRECEDEX) IN NS 20 MCG/5ML (4 MCG/ML) IV SYRINGE
PREFILLED_SYRINGE | INTRAVENOUS | Status: AC
  Filled 2021-12-04: qty 5

## 2021-12-04 MED ORDER — FENTANYL CITRATE (PF) 100 MCG/2ML IJ SOLN
INTRAMUSCULAR | Status: AC
  Filled 2021-12-04: qty 2

## 2021-12-04 MED ORDER — CEFAZOLIN SODIUM-DEXTROSE 2-4 GM/100ML-% IV SOLN
2.0000 g | INTRAVENOUS | Status: AC
  Administered 2021-12-04 (×2): 2 g via INTRAVENOUS

## 2021-12-04 MED ORDER — MIDAZOLAM HCL 5 MG/5ML IJ SOLN
INTRAMUSCULAR | Status: DC | PRN
  Administered 2021-12-04 (×2): 1 mg via INTRAVENOUS

## 2021-12-04 MED ORDER — FENTANYL CITRATE (PF) 100 MCG/2ML IJ SOLN: 25.0000 ug | INTRAMUSCULAR | Status: DC | PRN

## 2021-12-04 MED ORDER — HEPARIN SODIUM (PORCINE) 1000 UNIT/ML IJ SOLN
INTRAMUSCULAR | Status: DC | PRN
  Administered 2021-12-04: 5000 [IU] via INTRAVENOUS
  Administered 2021-12-04: 3000 [IU] via INTRAVENOUS
  Administered 2021-12-04: 2000 [IU] via INTRAVENOUS

## 2021-12-04 MED ORDER — ONDANSETRON HCL 4 MG/2ML IJ SOLN: 4.0000 mg | Freq: Four times a day (QID) | INTRAMUSCULAR | Status: DC | PRN

## 2021-12-04 MED ORDER — ALBUMIN HUMAN 5 % IV SOLN: INTRAVENOUS | Status: DC | PRN

## 2021-12-04 MED ORDER — PHENYLEPHRINE HCL (PRESSORS) 10 MG/ML IV SOLN
INTRAVENOUS | Status: DC | PRN
  Administered 2021-12-04: 80 ug via INTRAVENOUS
  Administered 2021-12-04 (×2): 160 ug via INTRAVENOUS
  Administered 2021-12-04: 80 ug via INTRAVENOUS
  Administered 2021-12-04 (×3): 160 ug via INTRAVENOUS
  Administered 2021-12-04: 40 ug via INTRAVENOUS
  Administered 2021-12-04 (×2): 160 ug via INTRAVENOUS
  Administered 2021-12-04: 40 ug via INTRAVENOUS
  Administered 2021-12-04: 120 ug via INTRAVENOUS
  Administered 2021-12-04 (×3): 160 ug via INTRAVENOUS
  Administered 2021-12-04 (×2): 80 ug via INTRAVENOUS
  Administered 2021-12-04: 160 ug via INTRAVENOUS

## 2021-12-04 MED ORDER — ASCORBIC ACID 500 MG PO TABS
1000.0000 mg | ORAL_TABLET | Freq: Every day | ORAL | Status: DC
  Administered 2021-12-04 – 2021-12-07 (×4): 1000 mg via ORAL
  Filled 2021-12-04 (×4): qty 2

## 2021-12-04 MED ORDER — ALUM & MAG HYDROXIDE-SIMETH 200-200-20 MG/5ML PO SUSP: 15.0000 mL | ORAL | Status: DC | PRN

## 2021-12-04 MED ORDER — CALCIUM CITRATE 950 (200 CA) MG PO TABS
200.0000 mg | ORAL_TABLET | Freq: Every day | ORAL | Status: DC
  Administered 2021-12-04 – 2021-12-07 (×4): 200 mg via ORAL
  Filled 2021-12-04 (×4): qty 1

## 2021-12-04 MED ORDER — PHENYLEPHRINE 200 MCG/ML (NON-ED) FOR PRIAPISM
100.0000 ug | Freq: Once | INTRAMUSCULAR | Status: AC
  Administered 2021-12-04: 100 ug via INTRACAVERNOUS
  Filled 2021-12-04: qty 10

## 2021-12-04 MED ORDER — LIDOCAINE HCL (CARDIAC) PF 100 MG/5ML IV SOSY
PREFILLED_SYRINGE | INTRAVENOUS | Status: DC | PRN
  Administered 2021-12-04: 50 mg via INTRAVENOUS

## 2021-12-04 MED ORDER — IODIXANOL 320 MG/ML IV SOLN
INTRAVENOUS | Status: DC | PRN
Start: 2021-12-04 — End: 2021-12-04
  Administered 2021-12-04: 110 mL

## 2021-12-04 MED ORDER — ALBUMIN HUMAN 5 % IV SOLN
INTRAVENOUS | Status: AC
  Administered 2021-12-04: 12.5 g via INTRAVENOUS
  Filled 2021-12-04: qty 250

## 2021-12-04 MED ORDER — ONDANSETRON HCL 4 MG/2ML IJ SOLN: 4.0000 mg | Freq: Once | INTRAMUSCULAR | Status: DC | PRN

## 2021-12-04 MED ORDER — OXYCODONE-ACETAMINOPHEN 5-325 MG PO TABS: 1.0000 | ORAL_TABLET | ORAL | Status: DC | PRN

## 2021-12-04 MED ORDER — DEXAMETHASONE SODIUM PHOSPHATE 10 MG/ML IJ SOLN
INTRAMUSCULAR | Status: DC | PRN
  Administered 2021-12-04: 8 mg via INTRAVENOUS

## 2021-12-04 MED ORDER — PHENYLEPHRINE HCL-NACL 20-0.9 MG/250ML-% IV SOLN
INTRAVENOUS | Status: AC
  Filled 2021-12-04: qty 250

## 2021-12-04 MED ORDER — MORPHINE SULFATE (PF) 2 MG/ML IV SOLN: 2.0000 mg | INTRAVENOUS | Status: DC | PRN

## 2021-12-04 MED ORDER — HEPARIN SODIUM (PORCINE) 1000 UNIT/ML IJ SOLN
INTRAMUSCULAR | Status: AC
  Filled 2021-12-04: qty 20

## 2021-12-04 MED ORDER — MAGNESIUM SULFATE 2 GM/50ML IV SOLN
2.0000 g | Freq: Every day | INTRAVENOUS | Status: DC | PRN
  Filled 2021-12-04: qty 50

## 2021-12-04 SURGICAL SUPPLY — 55 items
BALLN ARMADA 9X60X80 (BALLOONS) ×2
BALLN DORADO 10X80X80 (BALLOONS) ×4
BALLN DORADO 7X60X80 (BALLOONS) ×2
BALLN DORADO 8X100X80 (BALLOONS) ×4
BALLN ULTRVRSE 8X60X75C (BALLOONS) ×2
BALLOON ARMADA 9X60X80 (BALLOONS) IMPLANT
BALLOON DORADO 10X80X80 (BALLOONS) IMPLANT
BALLOON DORADO 7X60X80 (BALLOONS) IMPLANT
BALLOON DORADO 8X100X80 (BALLOONS) IMPLANT
BALLOON ULTRVRSE 8X60X75C (BALLOONS) IMPLANT
CATH ACCU-VU SIZ PIG 5F 70CM (CATHETERS) ×1 IMPLANT
CATH ANGIO 5F 80CM MHK 2 (CATHETERS) ×1 IMPLANT
CATH BALLN CODA 9X100X32 (BALLOONS) ×1 IMPLANT
CATH BEACON 5 .035 40 KMP TP (CATHETERS) IMPLANT
CATH BEACON 5 .035 65 KMP TIP (CATHETERS) ×1 IMPLANT
CATH BEACON 5 .038 40 KMP TP (CATHETERS) ×2
CATH TEMPO 5F RIM 65CM (CATHETERS) ×1 IMPLANT
CATH VS15FR (CATHETERS) ×2 IMPLANT
COVER DRAPE FLUORO 36X44 (DRAPES) ×2 IMPLANT
COVER PROBE U/S 5X48 (MISCELLANEOUS) ×1 IMPLANT
COVER SURGICAL LIGHT HANDLE (MISCELLANEOUS) ×1 IMPLANT
DEVICE DSSCT PLSMBLD 3.0S LGHT (MISCELLANEOUS) IMPLANT
DEVICE TORQUE .025-.038 (MISCELLANEOUS) ×1 IMPLANT
DRYSEAL FLEXSHEATH 12FR 33CM (SHEATH) ×1
DRYSEAL FLEXSHEATH 16FR 33CM (SHEATH) ×1
EXCLDR TRNK ENDO 26X14.5X12 16 (Endovascular Graft) ×2 IMPLANT
EXCLUDER TNK END 26X14.5X12 16 (Endovascular Graft) IMPLANT
GLIDEWIRE ADV .035X180CM (WIRE) ×1 IMPLANT
GLIDEWIRE STIFF .35X180X3 HYDR (WIRE) ×1 IMPLANT
GUIDEWIRE ADV .018X180CM (WIRE) ×1 IMPLANT
INTRODUCER 7FR 23CM (INTRODUCER) ×1 IMPLANT
KIT ENCORE 26 ADVANTAGE (KITS) ×2 IMPLANT
LEG CONTRALATERAL 16X12X10 (Vascular Products) ×1 IMPLANT
LEG CONTRALATERAL 16X12X12 (Vascular Products) ×1 IMPLANT
LEG CONTRALATERAL 16X12X14 (Vascular Products) ×1 IMPLANT
PACK ANGIOGRAPHY (CUSTOM PROCEDURE TRAY) ×3 IMPLANT
PLASMABLADE 3.0S W/LIGHT (MISCELLANEOUS) ×2
SHEATH  FLEX ANSEL 8FRX45 (SHEATH) ×1
SHEATH BRITE TIP 8FRX11 (SHEATH) ×3 IMPLANT
SHEATH DESTINATION 8F 45CM (SHEATH) ×1 IMPLANT
SHEATH DRYSEAL FLEX 12FR 33CM (SHEATH) IMPLANT
SHEATH DRYSEAL FLEX 16FR 33CM (SHEATH) IMPLANT
SHEATH FLEX ANSEL 8FRX45 (SHEATH) IMPLANT
SPONGE XRAY 4X4 16PLY STRL (MISCELLANEOUS) ×4 IMPLANT
STENT GRAFT CONTRALAT 16X12X10 (Vascular Products) IMPLANT
STENT GRAFT CONTRALAT 16X12X14 (Vascular Products) IMPLANT
STENT LIFESTREAM 12X58X80 (Permanent Stent) ×2 IMPLANT
STENT VIABAHN 10X10X120 (Permanent Stent) ×1 IMPLANT
STENT VIABAHN 10X5X120 (Permanent Stent) ×1 IMPLANT
STENT VIABAHN 9X5X120 (Permanent Stent) ×1 IMPLANT
SYR MEDRAD MARK 7 150ML (SYRINGE) ×1 IMPLANT
TUBING CONTRAST HIGH PRESS 72 (TUBING) ×2 IMPLANT
WIRE AMPLATZ SSTIFF .035X260CM (WIRE) ×2 IMPLANT
WIRE GUIDERIGHT .035X150 (WIRE) ×2 IMPLANT
WIRE STIFF LUNDERQUIST 260MM (WIRE) ×1 IMPLANT

## 2021-12-04 NOTE — Transfer of Care (Signed)
Immediate Anesthesia Transfer of Care Note ? ?Patient: Tracy Parsons ? ?Procedure(s) Performed: ENDOVASCULAR REPAIR/STENT GRAFT ? ?Patient Location: PACU ? ?Anesthesia Type:General ? ?Level of Consciousness: awake ? ?Airway & Oxygen Therapy: Patient Spontanous Breathing and Patient connected to face mask oxygen ? ?Post-op Assessment: Report given to RN and Post -op Vital signs reviewed and stable ? ?Post vital signs: Reviewed ? ?Last Vitals:  ?Vitals Value Taken Time  ?BP    ?Temp    ?Pulse    ?Resp    ?SpO2    ? ? ?Last Pain:  ?Vitals:  ? 12/04/21 0749  ?PainSc: 0-No pain  ?   ? ?  ? ?Complications:  ?Encounter Notable Events  ?Notable Event Outcome Phase Comment  ?Hematoma  Intraprocedure   ? ?

## 2021-12-04 NOTE — Anesthesia Procedure Notes (Signed)
Procedure Name: Intubation ?Date/Time: 12/04/2021 8:49 AM ?Performed by: Bea Graff, RN ?Pre-anesthesia Checklist: Patient identified, Patient being monitored, Timeout performed, Emergency Drugs available and Suction available ?Patient Re-evaluated:Patient Re-evaluated prior to induction ?Oxygen Delivery Method: Circle system utilized ?Preoxygenation: Pre-oxygenation with 100% oxygen ?Induction Type: IV induction ?Ventilation: Mask ventilation without difficulty ?Laryngoscope Size: Mac, 3 and McGraph ?Grade View: Grade I ?Tube type: Oral ?Tube size: 6.5 mm ?Number of attempts: 1 ?Airway Equipment and Method: Stylet ?Placement Confirmation: ETT inserted through vocal cords under direct vision, positive ETCO2 and breath sounds checked- equal and bilateral ?Secured at: 20 cm ?Tube secured with: Tape ?Dental Injury: Teeth and Oropharynx as per pre-operative assessment  ? ? ? ? ?

## 2021-12-04 NOTE — OR Nursing (Signed)
VasCure repair patch implanted Right Femoral Artery by DR Lucky Cowboy at 1253 in the Vascular Lab. ?Ref # [id number]    Lot # [id number]  EXP 09-04-2022 ? ?[employer] RFID # [id number] ? ?

## 2021-12-04 NOTE — Op Note (Signed)
OPERATIVE NOTE   PROCEDURE: Bilateral femoral artery cutdowns with open exposure Catheter placement into aorta from bilateral femoral approaches Placement of a 26 x 14 x 12 Gore Excluder Endoprosthesis main body with a 12 x 14 right contralateral limb Left iliac extension across the entire length of the external iliac artery for treatment of occlusive disease with a 12 mm x 12 cm iliac limb. Right iliac extension across the entire length of the external iliac artery with a 12 mm x 10 cm extension limb. Stent placement within the stent graft secondary to extrinsic calcific compression at the aortic bifurcation using 12 mm x 58 mm life stents in a kissing fashion. Additional stent placement left external iliac artery with a 9 mm x 5 cm Viabahn stent for occlusive disease. Additional stent placement right external iliac artery utilizing a 10 mm x 5 cm Viabahn stent for treatment of occlusive disease. Additional stent placement right external iliac artery utilizing a 10 mm x 10 cm Viabahn stent for treatment of persistent occlusive disease but also extravasation. Right superficial femoral endarterectomy with CorMatrix patch angioplasty   PRE-OPERATIVE DIAGNOSIS: AAA with profound atherosclerotic occlusive disease and rest pain of the right lower extremity  POST-OPERATIVE DIAGNOSIS: same  SURGEON: Hortencia Pilar, MD and Leotis Pain, MD - Co-surgeons  ANESTHESIA: general  ESTIMATED BLOOD LOSS: 1000 cc  FINDING(S): 1.  AAA  SPECIMEN(S):  none  INDICATIONS:   Tracy Parsons is a 76 y.o. y.o. female who presents with a greater than 5 cm abdominal aortic aneurysm as well as profound atherosclerotic occlusive disease with disabling claudication bilaterally and rest pain of the right lower extremity.  She has multiple comorbidities and is a poor candidate for open surgery and therefore endovascular repair has been recommended.  Risks and benefits of been reviewed with the patient all  questions have been answered patient has agreed to proceed..  DESCRIPTION: After obtaining full informed written consent, the patient was brought back to the operating room and placed supine upon the operating table.  The patient received IV antibiotics prior to induction.  After obtaining adequate anesthesia, the patient was prepped and draped in the standard fashion for endovascular AAA repair as well as treatment of the atherosclerotic occlusive disease.   Co-surgeons are required because this is a complex bilateral procedure with work being performed simultaneously from both the right femoral and left femoral approach.  This also expedites the procedure making a shorter operative time reducing complications and improving patient safety.  We then began by gaining access to both femoral arteries with US guidance with me working on the patient's left and Dr. Lucky Cowboy working on the patient's right.  The femoral arteries were found to be patent but severely diseased and associated with a complicated anatomic variation: The patient has a high bifurcation of the profunda and superficial femoral located above the ileal inguinal ligament (technically the patient does not have a true common femoral artery the external iliac transitions directly into the femoral bifurcation).  For these 2 reasons surgical cutdowns with direct exposure were required rather than percutaneous access.  With Dr. Lucky Cowboy working on the right and myself on the left as noted vertical incisions were made in both groins this dissection was carried down through the skin and subcutaneous tissues to expose the femoral sheath.  Femoral sheath was opened and the arteries were located.  It was at this point we began to understand the extent of the anatomic variation.  Both the SFA and the  profunda femoris were identified and dissected proximally until the bifurcation was encountered which was located above the ileal inguinal ligament.  Vesseloops were  then passed around multiple smaller branches as well as the SFA, profunda femoris and external iliac artery.  On the right there was no pulse in the right external iliac artery consistent with the preoperative CT showing occlusion.  The patient was then given 5000 units of intravenous heparin.  Additional 2000 units boluses were given during the case as well.   Working simultaneously the distalmost aspect of the external iliac artery was accessed with Seldinger needles and 8 French sheaths were inserted.  Hand-injection contrast was then utilized to create imaging of the iliac and distal aorta.  Working in tandem using a variety of wires including stiff angled glide advantage wires as well as a variety of catheters including rim catheters VS 1 catheters Kumpe catheters we were able to negotiate a stiff angled Glidewire from the left side into the true lumen of the aorta and this was confirmed by hand-injection of contrast.  Once we knew this was feasible we continue to work to cross the bifurcation and/or get a wire from the right side into the true lumen.  Given the large size of the aneurysm and the extensive clot in conjunction with a profound calcific occlusive disease this proved to be very difficult and indeed the first 40 minutes of fluoroscopy time was spent in securing wire access from both groins.  Ultimately we were able to get a Glidewire from the left to the right and pull it extracorporeally.  At this point we were then able to perform angioplasty on both the left and right iliacs and then ultimately using a sheath in tandem with Kumpe catheter and wires doubly loaded through the sheaths able to get the wire from the left into the aorta as well as the wire from the right.  Once we did secure wire access Kumpe catheter was advanced from the right and hand-injection contrast verified intraluminal positioning.  We now had secure access from both groins into the true lumen of the aorta and proceeded  with aneurysm repair.  The Pigtail catheter was placed into the aorta from the left side. Using this image, we selected a 26 x 14 x 12 Main body device.  Over a Lunderquist wire, an 11 French sheath was placed. The main body was then placed through the 16 French sheath. A Kumpe catheter was placed up the right side and a magnified image at the renal arteries was performed. The main body was then deployed just below the lowest renal artery. With a buddy wire in place from the right the Kumpe catheter was used to cannulate the contralateral gate without difficulty and successful cannulation was confirmed by twirling the pigtail catheter in the main body. We then placed a stiff wire and a retrograde arteriogram was performed through the right femoral sheath. We upsized to the 12 [place] sheath for the contralateral limb and a 12 x 14 limb was selected and deployed. The main body deployment was then completed. Based off the angiographic findings, extension limbs were necessary.  A 12 x 12 extender limb was then utilized on the left.  All junction points and seals zones were treated with the compliant balloon.  Given the profound calcific nature of her disease at the aortic bifurcation even after compliant balloon there was still greater than 40% impingement and we elected to place 212 mm x 58 mm lifestream stents within  this region essentially reinforcing the bifurcation of the graft as it the limbs coursed through the origins of the iliac arteries.  Follow-up imaging now demonstrated extensive occlusive disease within the external iliacs.  On the left this was treated with an additional 9 mm x 5 cm Viabahn stent which essentially deployed down to the level of the ileal inguinal ligament with good result and less than 10% residual stenosis.  On the right initially we extended as noted with the 12 x 10 extender limb.  Persistent occlusive disease of the right external iliac was identified and therefore a 10 mm x 5 cm  Viabahn stent was used to extend down toward the ileal inguinal ligament.  This was then postdilated.  Follow-up imaging noted extravasation distally and this was treated with an additional 10 mm x 10 cm Viabahn stent which was then postdilated to obtain seal follow-up imaging demonstrated that the extravasation was controlled and the there was less than 10% residual stenosis all the way through the external iliac down to within millimeters of the femoral bifurcation.  The pigtail catheter was then replaced and a completion angiogram was performed.   Possibly a faint type II endoleak was detected on completion angiography. The renal arteries were found to be widely patent.  There was no extravasation and both the aortic bifurcation and flow divider of the stent graft were widely patent and the entire length of the external iliac arteries were patent with less than 10% residual stenosis..   At this point we elected to terminate the procedure.  Both groins were irrigated with sterile saline on the right fibrillar and Vistaseal was added.  Both groins were then closed in multiple layers using 2-0 Vicryl for 2 layers followed by 3-0 Vicryl for 2 layers and then 4-0 Monocryl subcuticular with Dermabond.  Honeycomb dressings were applied.  The patient was taken to the recovery room in stable condition.  CONDITION: stable  Hortencia Pilar  12/04/2021, 5:43 PM

## 2021-12-04 NOTE — Op Note (Signed)
OPERATIVE NOTE   PROCEDURE: Bilateral femoral artery cutdowns for placement of endoprosthesis Catheter placement into aorta from bilateral femoral approaches and catheter placement from left common femoral artery into right common femoral artery Placement of a 26 mm proximal 12 cm length Gore Excluder Endoprosthesis main body left with a 12 mm diameter by 14 cm length right contralateral limb Left iliac extension limb beyond left hypogastric artery with a 12 mm diameter by 12 cm length iliac limb Right iliac limb extension beyond right hypogastric artery with 12 mm diameter by 10 cm length right iliac extension limb Stent placements within the stent graft to help support the Excluder endoprosthesis with a pair of 12 mm diameter by 58 mm length lifestream balloon expandable stents in the distal aorta and proximal common iliac arteries bilaterally Stent placement left external iliac artery with 9 mm diameter by 5 cm length Viabahn stent for occlusive disease Stent placement right external iliac artery with 10 mm diameter by 5 cm length Viabahn stent for occlusive disease Stent placement distal right external iliac artery and right common femoral artery for both occlusive disease and extravasation with 10 mm diameter by 10 cm length Viabahn stent Right superficial femoral artery endarterectomy and patch angioplasty  PRE-OPERATIVE DIAGNOSIS: AAA, severe peripheral arterial disease with disabling claudication symptoms right worse than left  POST-OPERATIVE DIAGNOSIS: same  SURGEON: Festus Barren, MD and Levora Dredge, MD - Co-surgeons  ANESTHESIA: General  ESTIMATED BLOOD LOSS: 1000 cc  FINDING(S): 1.  AAA  SPECIMEN(S):  none  INDICATIONS:   Tracy Parsons is a 76 y.o. female who presents with a greater than 5 cm abdominal aortic aneurysm as well as severe occlusive disease with disabling claudication symptoms right greater than left. The anatomy was suitable for endovascular repair  although extensive treatment for occlusive disease below the aneurysm would also be required and a possible femoral-femoral bypass would not cross the right iliac occlusion would also be necessary.  Risks and benefits of repair in an endovascular fashion were discussed and informed consent was obtained. Co-surgeons are used to expedite the procedure and reduce operative time as bilateral work needs to be done.  DESCRIPTION: After obtaining full informed written consent, the patient was brought back to the operating room and placed supine upon the operating table.  The patient received IV antibiotics prior to induction.  After obtaining adequate anesthesia, the patient was prepped and draped in the standard fashion for endovascular AAA repair.  We then began by gaining access to both femoral arteries with vertical cutdowns with me working on the right and Dr. Gilda Crease working on the left.  The femoral arteries were found to be small and high bifurcations were seen on each side.  Control was obtained on the common femoral artery, profunda femoris artery, superficial femoral artery and branches on the left.  On the right, the control was actually in the external iliac artery and branches as well as the SFA and profunda femoris arteries were controlled with Vesseloops.  On the left, the vessel was diseased but not as extensively so and we accessed this with a Seldinger needle in the distal common femoral artery and placed an 8 French sheath.  On the right side, with a right iliac occlusion we knew Crossing this would create difficulty.  The profunda femoris was very small and the proximal SFA was heavily diseased and required an endarterectomy to improve runoff. The patient was then given 5000 units of intravenous heparin.  Additional heparin was given  during the procedure as needed.   Control was pulled up on the Vesseloops on the right side and an anterior wall arteriotomy created with an 11 blade and extended with  Potts scissors in the right superficial femoral artery up to its origin.  It was taken down about 4 to 5 cm in the right SFA and a nice feathered distal endpoint was tacked down with a pair of 7-0 Prolene sutures.  The plaque was removed with a Therapist, nutritional.  After completing the aneurysm repair, CorMatrix patch would be used for patch angioplasty of the right superficial femoral artery.  We then placed an 8 French sheath and an advantage wire on the right side.   On the left side, there was a near occlusive stenosis in the left proximal external iliac artery as well as the left common iliac artery proximally.  These were separate distinct lesions.  On the right side, the iliac arteries were occluded.  Attempts to cross the occlusion from the right side with multiple different catheters and wires were performed but I was never able to gain intraluminal flow back into the aorta.  Ultimately, Dr. Gilda Crease was able to use the rim catheter and Glidewire and advanced this down with a Glidewire coming out the right femoral arteriotomy.  Now that we had access here, balloon angioplasty was performed in the left external and common iliac arteries with 7 mm diameter angioplasty balloons.  I then placed the sheath up from the right side using a 7 French 23 cm sheath and balloon angioplasty was performed in the right external and common iliac arteries up to the aortic bifurcation with 8 mm balloons.  Once we did this, a 0.018 advantage wire and rim catheter were used to gain intraluminal flow into the aorta and then exchanged for a stiff wire.  We then upsized to a 12 French sheath on the right side. The Pigtail catheter was placed into the aorta from the right side. Using this image, we selected a 26 mm diameter by 12 cm length conformable Gore Excluder Main body device.  Over a stiff wire, an 16 French sheath was placed up the left side. The main body was then placed through the 16 French sheath. A Kumpe catheter was  placed up the right side and a magnified image at the renal arteries was performed. The main body was then deployed just below the lowest renal artery which was the left. The Kumpe catheter was used to cannulate the contralateral gate without difficulty and successful cannulation was confirmed by twirling the pigtail catheter in the main body. We then placed a stiff wire and a retrograde arteriogram was performed through the right femoral sheath. We had already upsized to the 12 [place] sheath for the contralateral limb and a 12 mm diameter by 14 cm length right iliac limb was selected and deployed.  This went into the right external iliac artery.  The main body deployment was then completed. Based off the angiographic findings, extension limbs were necessary.  A 12 mm diameter by 12 cm length left iliac limb was taken down to the mid left external iliac artery.  There was additional occlusive disease on each side the need to be treated.  On the left, a 9 mm diameter by 5 cm length Viabahn stent was deployed in the left external iliac artery for occlusive disease.  On the right, a 10 mm diameter by 5 cm length Viabahn stent was deployed for occlusive disease.  We then  placed 10 mm high-pressure balloons in the distal aorta and proximal common iliac arteries and inflated these in a kissing balloon fashion.  8 mm balloons were then used in the distal common and external iliac arteries.  The contralateral gate and contralateral limb were treated with a compliant balloon at the gate.  Due to the constraint in the distal aorta and proximal common iliac arteries, a pair of 12 mm diameter by 58 mm length lifestream stents were deployed within the stent graft to help provide increased radial strength in the distal aorta and proximal common iliac arteries.  Completion imaging showed no type I endoleak proximally and possibly a small type II endoleak.  The left iliac artery had brisk flow and there was no residual stenosis  after additional stent placement.  The right iliac artery had residual stenosis as well as extravasation in the right external iliac artery.  To address this issue on the right, an additional 12 mm diameter by 10 cm length iliac limb was placed in the common iliac artery to the mid external iliac artery and then an additional 10 mm diameter by 10 cm length Viabahn stent was deployed all the way down to the common femoral artery up into the proximal external iliac artery.  Completion imaging following this showed no residual extravasation and no significant residual stenosis in the right iliac system or right common femoral artery. The pigtail catheter was then replaced and a completion angiogram was performed.  A possible small type II endoleak was detected on completion angiography. The renal arteries were found to be widely patent.  Her hypogastric arteries remained occluded as they were at the beginning of the procedure. At this point we elected to terminate the procedure.  The left femoral arteriotomy was closed with a series of interrupted 6-0 Prolene sutures after removal of the 16 French sheath.  On the right side, the arteriotomy in the right SFA was then repaired after cutting a CorMatrix patch to fit the arteriotomy from the endarterectomy site.  6-0 Prolene was started at the proximal endpoint and run one half the length of the arteriotomy with a second 6-0 Prolene started at the distal endpoint to complete the arteriotomy after flushing maneuvers.  The wounds were then irrigated.  Fibrillar and Vistacel was placed.  The incisions were closed with 2 layers of 2-0 Vicryl, and 2 layers of 3-0 Vicryl.  The skin incision on each side was closed with a 4-0 Monocryl. Dermabond and pressure dressing were placed. The patient was taken to the recovery room in stable condition having tolerated the procedure well.  COMPLICATIONS: none  CONDITION: stable  Festus Barren  12/04/2021, 2:21 PM   This note was  created with Dragon Medical transcription system. Any errors in dictation are purely unintentional.

## 2021-12-04 NOTE — Interval H&P Note (Signed)
History and Physical Interval Note: ? ?12/04/2021 ?8:32 AM ? ?Tracy Parsons  has presented today for surgery, with the diagnosis of AAA  GORE   ANESTHESIA  AAA Repair  ?Dr Wyn Quaker w Dr Gilda Crease to assist.  The various methods of treatment have been discussed with the patient and family. After consideration of risks, benefits and other options for treatment, the patient has consented to  Procedure(s): ?ENDOVASCULAR REPAIR/STENT GRAFT (N/A) as a surgical intervention.  The patient's history has been reviewed, patient examined, no change in status, stable for surgery.  I have reviewed the patient's chart and labs.  Questions were answered to the patient's satisfaction.   ? ? ?Festus Barren ? ? ?

## 2021-12-05 ENCOUNTER — Encounter: Payer: Self-pay | Admitting: Vascular Surgery

## 2021-12-05 LAB — BASIC METABOLIC PANEL
Anion gap: 7 (ref 5–15)
BUN: 16 mg/dL (ref 8–23)
CO2: 25 mmol/L (ref 22–32)
Calcium: 8 mg/dL — ABNORMAL LOW (ref 8.9–10.3)
Chloride: 106 mmol/L (ref 98–111)
Creatinine, Ser: 1 mg/dL (ref 0.44–1.00)
GFR, Estimated: 58 mL/min — ABNORMAL LOW (ref >60)
Glucose, Bld: 151 mg/dL — ABNORMAL HIGH (ref 70–99)
Potassium: 4.5 mmol/L (ref 3.5–5.1)
Sodium: 138 mmol/L (ref 135–145)

## 2021-12-05 LAB — TYPE AND SCREEN
ABO/RH(D): O POS
Antibody Screen: NEGATIVE
Unit division: 0
Unit division: 0

## 2021-12-05 LAB — BPAM RBC
Blood Product Expiration Date: 202304022359
Blood Product Expiration Date: 202304022359
ISSUE DATE / TIME: 202303011225
ISSUE DATE / TIME: 202303011225
Unit Type and Rh: 5100
Unit Type and Rh: 5100

## 2021-12-05 LAB — CBC
HCT: 25.1 % — ABNORMAL LOW (ref 36.0–46.0)
Hemoglobin: 8.4 g/dL — ABNORMAL LOW (ref 12.0–15.0)
MCH: 30.3 pg (ref 26.0–34.0)
MCHC: 33.5 g/dL (ref 30.0–36.0)
MCV: 90.6 fL (ref 80.0–100.0)
Platelets: 133 10*3/uL — ABNORMAL LOW (ref 150–400)
RBC: 2.77 MIL/uL — ABNORMAL LOW (ref 3.87–5.11)
RDW: 15.2 % (ref 11.5–15.5)
WBC: 11.8 10*3/uL — ABNORMAL HIGH (ref 4.0–10.5)
nRBC: 0 % (ref 0.0–0.2)

## 2021-12-05 LAB — SURGICAL PATHOLOGY

## 2021-12-05 MED ORDER — SERTRALINE HCL 50 MG PO TABS
100.0000 mg | ORAL_TABLET | Freq: Every day | ORAL | Status: AC
  Administered 2021-12-05: 100 mg via ORAL
  Filled 2021-12-05: qty 2

## 2021-12-05 MED ORDER — FAMOTIDINE 20 MG PO TABS
20.0000 mg | ORAL_TABLET | Freq: Every day | ORAL | Status: DC
  Administered 2021-12-05 – 2021-12-07 (×3): 20 mg via ORAL
  Filled 2021-12-05 (×3): qty 1

## 2021-12-05 MED ORDER — CHLORHEXIDINE GLUCONATE CLOTH 2 % EX PADS
6.0000 | MEDICATED_PAD | Freq: Every day | CUTANEOUS | Status: DC
  Administered 2021-12-05 – 2021-12-07 (×3): 6 via TOPICAL

## 2021-12-05 MED ORDER — SERTRALINE HCL 50 MG PO TABS
200.0000 mg | ORAL_TABLET | Freq: Every day | ORAL | Status: DC
  Administered 2021-12-06: 200 mg via ORAL
  Filled 2021-12-05: qty 4

## 2021-12-05 NOTE — Progress Notes (Signed)
?  Transition of Care (TOC) Screening Note ? ? ?Patient Details  ?Name: Tracy Parsons ?Date of Birth: [birth year] ? ? ?Transition of Care (TOC) CM/SW Contact:    ?Shelbie Hutching, RN ?Phone Number: ?12/05/2021, 11:45 AM ? ? ? ?Transition of Care Department [facility]) has reviewed patient and no TOC needs have been identified at this time. We will continue to monitor patient advancement through interdisciplinary progression rounds. If new patient transition needs arise, please place a TOC consult. ?  ?

## 2021-12-05 NOTE — Addendum Note (Signed)
Addendum  created 12/05/21 0728 by Kory Rains W, CRNA  ? Clinical Note Signed  ?  ?

## 2021-12-05 NOTE — Progress Notes (Signed)
Pt A&Ox4 and in NAD at time of bedside shift report, hand off obtained from RN Leslie/ Bilateral fem.honeycomb dressings evaluated stable- clean & dry. Bilateral LL pedal pulses +2. No questions or concerns posed to this RN from pt perspective .  ?

## 2021-12-05 NOTE — Progress Notes (Signed)
PHARMACIST - PHYSICIAN COMMUNICATION ? ?CONCERNING: IV to Oral Route Change Policy ? ?RECOMMENDATION: ?This patient is receiving famotidine by the intravenous route.  Based on criteria approved by the Pharmacy and Therapeutics Committee, the intravenous medication(s) is/are being converted to the equivalent oral dose form(s). ? ?Frequency adjusted to daily given CrCl 30 - 59 cc/min ? ?DESCRIPTION: ?These criteria include: ?The patient is eating (either orally or via tube) and/or has been taking other orally administered medications for a least 24 hours ?The patient has no evidence of active gastrointestinal bleeding or impaired GI absorption (gastrectomy, short bowel, patient on TNA or NPO). ? ?If you have questions about this conversion, please contact the Pharmacy Department  ? ?Benita Gutter, RPH ?12/05/2021 8:12 AM  ?

## 2021-12-05 NOTE — Anesthesia Postprocedure Evaluation (Addendum)
Anesthesia Post Note ? ?Patient: [name] ? ?Procedure(s) Performed: ENDOVASCULAR REPAIR/STENT GRAFT ? ?Patient location during evaluation: SICU ?Anesthesia Type: General ?Level of consciousness: lethargic ?Pain management: pain level controlled ?Vital Signs Assessment: post-procedure vital signs reviewed and stable ?Respiratory status: spontaneous breathing ?Cardiovascular status: stable ?Postop Assessment: no apparent nausea or vomiting ?Anesthetic complications: yes ? ? ?Encounter Notable Events  ?Notable Event Outcome Phase Comment  ?Hematoma  Intraprocedure   ? ? ? ?Last Vitals:  ?Vitals:  ? 12/05/21 0500 12/05/21 0600  ?BP: (!) 149/71 136/64  ?Pulse: 80 77  ?Resp: 10 12  ?Temp:    ?SpO2: 100% 100%  ?  ?Last Pain:  ?Vitals:  ? 12/05/21 0400  ?TempSrc: Oral  ?PainSc:   ? ? ?  ?  ?  ?  ?  ?  ? ?Lynden Oxford ? ? ? ? ?

## 2021-12-06 ENCOUNTER — Ambulatory Visit: Payer: Medicare Other | Admitting: Cardiology

## 2021-12-06 NOTE — Progress Notes (Signed)
[facility] ? ?Daily Progress Note ? ? ?Subjective  -  ? ?Tracy Parsons 76 year old female that underwent intervention on 12/04/2020 including: ? ?Bilateral femoral artery cutdowns for placement of endoprosthesis ?Catheter placement into aorta from bilateral femoral approaches and catheter placement from left common femoral artery into right common femoral artery ?Placement of a 26 mm proximal 12 cm length Gore Excluder Endoprosthesis main body left with a 12 mm diameter by 14 cm length right contralateral limb ?Left iliac extension limb beyond left hypogastric artery with a 12 mm diameter by 12 cm length iliac limb ?Right iliac limb extension beyond right hypogastric artery with 12 mm diameter by 10 cm length right iliac extension limb ?Stent placements within the stent graft to help support the Excluder endoprosthesis with a pair of 12 mm diameter by 58 mm length lifestream balloon expandable stents in the distal aorta and proximal common iliac arteries bilaterally ?Stent placement left external iliac artery with 9 mm diameter by 5 cm length Viabahn stent for occlusive disease ?Stent placement right external iliac artery with 10 mm diameter by 5 cm length Viabahn stent for occlusive disease ?Stent placement distal right external iliac artery and right common femoral artery for both occlusive disease and extravasation with 10 mm diameter by 10 cm length Viabahn stent ?Right superficial femoral artery endarterectomy and patch angioplasty ? ?Today the patient is resting comfortably in bed.  She denies any significant pain.  She has not yet been able to ambulate.  The groin sites are clean dry and intact with warmth.  2+ pulses bilaterally ?Objective ?Vitals:  ? 12/05/21 1600 12/05/21 1700 12/05/21 1800 12/05/21 1900  ?BP: 135/61 (!) 81/69 (!) 107/53   ?Pulse: 83 94 92   ?Resp: 18 14 15    ?Temp: 98.4 ?F (36.9 ?C)   98.1 ?F (36.7 ?C)  ?TempSrc: Oral   Oral  ?SpO2: 98% 97% 100%   ?Weight:       ?Height:      ? ? ?Intake/Output Summary (Last 24 hours) at 12/06/2021 0018 ?Last data filed at 12/05/2021 1900 ?Gross per 24 hour  ?Intake 667.68 ml  ?Output 885 ml  ?Net -217.32 ml  ? ? ?PULM  CTAB ?CV  RRR ?VASC  2+ pulses, soft intact groins, warm feet bilaterally ? ?Laboratory ?CBC ?   ?Component Value Date/Time  ? WBC 11.8 (H) 12/05/2021 0451  ? HGB 8.4 (L) 12/05/2021 0451  ? HCT 25.1 (L) 12/05/2021 0451  ? PLT 133 (L) 12/05/2021 0451  ? ? ?BMET ?   ?Component Value Date/Time  ? NA 138 12/05/2021 0451  ? K 4.5 12/05/2021 0451  ? CL 106 12/05/2021 0451  ? CO2 25 12/05/2021 0451  ? GLUCOSE 151 (H) 12/05/2021 0451  ? BUN 16 12/05/2021 0451  ? CREATININE 1.00 12/05/2021 0451  ? CALCIUM 8.0 (L) 12/05/2021 0451  ? GFRNONAA 58 (L) 12/05/2021 0451  ? ? ?Assessment/Planning: ?POD #1 s/p endovascular aortic repair with right superficial femoral endarterectomy ? ?The bilateral groins appear clean dry and intact.  The patient is doing well today in good spirits with no significant pain.  There are some tenderness at the groins which is to be expected.  Both feet are warm with 2+ pulses bilaterally.  We will plan on discharging patient tomorrow barring any complications. ? ?Kris Hartmann ? ?12/06/2021, 12:18 AM ? ? ? ?  ?

## 2021-12-06 NOTE — Progress Notes (Signed)
Pr for transfer to 1C. Report given to 1C RN. Vitals stable, no other issues noted. Pt and spouse updated with plan of care.  ?

## 2021-12-06 NOTE — Progress Notes (Signed)
NP Jon Billings notified of pt's decrease in hgb from 8.7 to 8.4 no new orders at this time  ?

## 2021-12-06 NOTE — Evaluation (Signed)
Physical Therapy Evaluation ?Patient Details ?Name: Lannette Avellino ?MRN: [id number] ?DOB: 1946-05-30 ?Today's Date: 12/06/2021 ? ?History of Present Illness ? 76yo female with PMHx including HTN, CAD, PVD, depression, hypothyroidism, and HLD presented to hospital for scheduled surgical repair of AAA. Now s/p endovascular aortic repair with right superficial femoral endarterectomy on 12/04/21.  ?Clinical Impression ? Patient received in bed sleeping. She easily wakes and agrees to PT session. Patient is mod independent with bed mobility. Requires increased time due to soreness. Patient ambulated into bathroom, performed peri care independently and then ambulated to door and back to bed with RW and min guard. Slow cadence due to soreness. Patient will continue to benefit from skilled PT while here to improve functional mobility and independence.      ?   ? ?Recommendations for follow up therapy are one component of a multi-disciplinary discharge planning process, led by the attending physician.  Recommendations may be updated based on patient status, additional functional criteria and insurance authorization. ? ?Follow Up Recommendations No PT follow up ? ?  ?Assistance Recommended at Discharge Intermittent Supervision/Assistance  ?Patient can return home with the following ?   ? ?  ?Equipment Recommendations    ?Recommendations for Other Services ?    ?  ?Functional Status Assessment Patient has had a recent decline in their functional status and demonstrates the ability to make significant improvements in function in a reasonable and predictable amount of time.  ? ?  ?Precautions / Restrictions Precautions ?Precautions: Fall ?Precaution Comments: bilat femoral/groin incisions ?Restrictions ?Weight Bearing Restrictions: No  ? ?  ? ?Mobility ? Bed Mobility ?Overal bed mobility: Modified Independent ?  ?  ?  ?  ?  ?  ?General bed mobility comments: no physical assist needed, increased time ?  ? ?Transfers ?Overall transfer  level: Needs assistance ?Equipment used: Rolling walker (2 wheels) ?Transfers: Sit to/from Stand ?Sit to Stand: Min guard ?  ?  ?  ?  ?  ?General transfer comment: increased time due to discomfort ?  ? ?Ambulation/Gait ?Ambulation/Gait assistance: Min guard, Supervision ?Gait Distance (Feet): 30 Feet ?Assistive device: Rolling walker (2 wheels) ?Gait Pattern/deviations: Step-through pattern, Decreased step length - right, Decreased step length - left ?Gait velocity: decr ?  ?  ?General Gait Details: good balance, tender with movement, R knee pain, but reports this is improved from this am ? ?Stairs ?  ?  ?  ?  ?  ? ?Wheelchair Mobility ?  ? ?Modified Rankin (Stroke Patients Only) ?  ? ?  ? ?Balance Overall balance assessment: Modified Independent ?Sitting-balance support: Feet supported ?Sitting balance-Leahy Scale: Good ?  ?  ?Standing balance support: Bilateral upper extremity supported, During functional activity ?Standing balance-Leahy Scale: Good ?  ?  ?  ?  ?  ?  ?  ?  ?  ?  ?  ?  ?   ? ? ? ?Pertinent Vitals/Pain Pain Assessment ?Pain Assessment: Faces ?Faces Pain Scale: Hurts little more ?Pain Location: knee and B femoral incision ?Pain Descriptors / Indicators: Discomfort, Sore, Tightness ?Pain Intervention(s): Monitored during session, Repositioned  ? ? ?Home Living Family/patient expects to be discharged to:: Private residence ?Living Arrangements: Spouse/significant other ?Available Help at Discharge: Family;Available 24 hours/day ?Type of Home: Apartment ?Home Access: Elevator ?  ?  ?  ?Home Layout: One level ?Home Equipment: Agricultural consultant (2 wheels);Cane - single point;Shower seat;Hand held shower head;Adaptive equipment ?   ?  ?Prior Function Prior Level of Function : History of Falls (  last six months);Independent/Modified Independent ?  ?  ?  ?  ?  ?  ?Mobility Comments: SPC for short distances, 2WW for longer distances, 1 fall (turned too quickly in kitchen, got 2 subdural hematomas) ?ADLs Comments:  indep with basic ADL, med mgt, [facility] provides cleaning services and 3 meals/day at the dining room, pt indep with laundry. She does not drive since RLE pain, spouse drives ?  ? ? ?Hand Dominance  ?   ? ?  ?Extremity/Trunk Assessment  ? Upper Extremity Assessment ?Upper Extremity Assessment: Overall WFL for tasks assessed ?  ? ?Lower Extremity Assessment ?Lower Extremity Assessment: Overall WFL for tasks assessed ?  ? ?Cervical / Trunk Assessment ?Cervical / Trunk Assessment: Normal  ?Communication  ? Communication: No difficulties  ?Cognition Arousal/Alertness: Awake/alert ?Behavior During Therapy: [place] for tasks assessed/performed ?Overall Cognitive Status: Within Functional Limits for tasks assessed ?  ?  ?  ?  ?  ?  ?  ?  ?  ?  ?  ?  ?  ?  ?  ?  ?  ?  ?  ? ?  ?General Comments   ? ?  ?Exercises    ? ?Assessment/Plan  ?  ?PT Assessment Patient needs continued PT services  ?PT Problem List Decreased activity tolerance;Pain;Decreased mobility ? ?   ?  ?PT Treatment Interventions Gait training;Therapeutic activities;Patient/family education;Functional mobility training   ? ?PT Goals (Current goals can be found in the Care Plan section)  ?Acute Rehab PT Goals ?Patient Stated Goal: return home ?PT Goal Formulation: With patient ?Time For Goal Achievement: 12/13/21 ?Potential to Achieve Goals: Good ? ?  ?Frequency Min 2X/week ?  ? ? ?Co-evaluation   ?  ?  ?  ?  ? ? ?  ?AM-PAC PT "6 Clicks" Mobility  ?Outcome Measure Help needed turning from your back to your side while in a flat bed without using bedrails?: None ?Help needed moving from lying on your back to sitting on the side of a flat bed without using bedrails?: None ?Help needed moving to and from a bed to a chair (including a wheelchair)?: A Little ?Help needed standing up from a chair using your arms (e.g., wheelchair or bedside chair)?: A Little ?Help needed to walk in hospital room?: A Little ?Help needed climbing 3-5 steps with a railing? : A Little ?6  Click Score: 20 ? ?  ?End of Session   ?Activity Tolerance: Patient limited by pain ?Patient left: in bed;with call bell/phone within reach;with bed alarm set ?Nurse Communication: Mobility status ?PT Visit Diagnosis: Difficulty in walking, not elsewhere classified (R26.2);Pain ?Pain - Right/Left:  (B) ?Pain - part of body:  (femoral incisions, R knee) ?  ? ?Time: 2297-9892 ?PT Time Calculation (min) (ACUTE ONLY): 19 min ? ? ?Charges:   PT Evaluation ?$PT Eval Moderate Complexity: 1 Mod ?  ?  ?   ? ? ?Lissa Merlin, PT, GCS ?12/06/21,1:45 PM ? ? ?

## 2021-12-06 NOTE — Evaluation (Signed)
Occupational Therapy Evaluation Patient Details Name: Tracy Parsons MRN: [id number] DOB: [birth year] Today's Date: 12/06/2021   History of Present Illness 76yo female with PMHx including HTN, CAD, PVD, depression, hypothyroidism, and HLD presented to hospital for scheduled surgical repair of AAA. Now s/p endovascular aortic repair with right superficial femoral endarterectomy on 12/04/21.   Clinical Impression   Pt was seen for OT evaluation this date. Prior to hospital admission, pt was ambulating with a SPC for short distances, using 2WW for longer distances, indep with basic ADL, med mgt, and laundry, and spouse drove 2/2 pt's RLE pain. Pt lives with her spouse at [facility] [place] apartment with elevator access and facility provides cleaning services and 3 meals/day at the dining hall. Pt received in recliner and agreeable to session. Pt completed ADL transfers and walked to the sink with RW and CGA for standing grooming tasks with SBA, endorsing significant R knee pain with weightbearing (chronic but worse than normal). Also endorsed a more mild pain in R groin with standing that eased with time. Pt requires MINA for LB ADL tasks 2/2 R groin and R knee pain with bending. Pt reports spouse able to assist as needed. Currently pt demonstrates impairments as described below (See OT problem list) which functionally limit her ability to perform ADL/self-care tasks. Pt educated in home/routines modifications, AE/DME for LB ADL tasks, and falls prevention strategies. She verbalized understanding and would benefit from skilled OT services to address noted impairments and functional limitations (see below for any additional details) in order to maximize safety and independence while minimizing falls risk and caregiver burden. Upon hospital discharge, do not anticipate additional skilled OT needs.    Recommendations for follow up therapy are one component of a multi-disciplinary discharge planning process, led by the  attending physician.  Recommendations may be updated based on patient status, additional functional criteria and insurance authorization.   Follow Up Recommendations  No OT follow up    Assistance Recommended at Discharge Intermittent Supervision/Assistance  Patient can return home with the following A little help with walking and/or transfers;A little help with bathing/dressing/bathroom;Assistance with cooking/housework;Assist for transportation;Help with stairs or ramp for entrance    Functional Status Assessment  Patient has had a recent decline in their functional status and demonstrates the ability to make significant improvements in function in a reasonable and predictable amount of time.  Equipment Recommendations  None recommended by OT (has all recommended equipment. declined BSC)    Recommendations for Other Services       Precautions / Restrictions Precautions Precautions: Fall Precaution Comments: bilat femoral/groin incisions Restrictions Weight Bearing Restrictions: No      Mobility Bed Mobility               General bed mobility comments: NT, received up in recliner after nursing helped her out of bed    Transfers Overall transfer level: Needs assistance Equipment used: Rolling walker (2 wheels) Transfers: Sit to/from Stand Sit to Stand: Min guard                  Balance Overall balance assessment: Needs assistance Sitting-balance support: No upper extremity supported, Feet supported Sitting balance-Leahy Scale: Good     Standing balance support: No upper extremity supported, During functional activity, Reliant on assistive device for balance Standing balance-Leahy Scale: Fair Standing balance comment: static standing at sink for grooming wihtout LOB, reliant on RW for mobility/dynamic balance  ADL either performed or assessed with clinical judgement   ADL Overall ADL's : Needs assistance/impaired                                        General ADL Comments: Pt currently requires MIN A for LB ADL tasks 2/2 increased groin and R knee pain with bending and with standing. CGA for ADL transfers with RW. Pt reports spouse able to assist.     Vision         Perception     Praxis      Pertinent Vitals/Pain Pain Assessment Pain Assessment: 0-10 Pain Score: 10-Worst pain ever Pain Location: top of R knee with mobility, improves to 4/10 at rest once seated Pain Descriptors / Indicators: Aching, Burning, Grimacing, Guarding Pain Intervention(s): Limited activity within patient's tolerance, Monitored during session, Repositioned     Hand Dominance     Extremity/Trunk Assessment Upper Extremity Assessment Upper Extremity Assessment: Overall WFL for tasks assessed   Lower Extremity Assessment Lower Extremity Assessment: RLE deficits/detail;Defer to PT evaluation RLE: Unable to fully assess due to pain   Cervical / Trunk Assessment Cervical / Trunk Assessment: Normal   Communication Communication Communication: No difficulties   Cognition Arousal/Alertness: Awake/alert Behavior During Therapy: WFL for tasks assessed/performed Overall Cognitive Status: Within Functional Limits for tasks assessed                                       General Comments       Exercises Other Exercises Other Exercises: Pt educated in home/routines modifications, AE/DME for LB ADL tasks, and falls prevention strategies.   Shoulder Instructions      Home Living Family/patient expects to be discharged to:: Private residence ([place] apartment) Living Arrangements: Spouse/significant other Available Help at Discharge: Family;Available 24 hours/day Type of Home: Apartment (at [facility]) Home Access: Elevator     Home Layout: One level     Bathroom Shower/Tub: Walk-in Soil scientist Toilet: Handicapped height     Home Equipment: Agricultural consultant (2  wheels);Cane - single point;Shower seat;Hand held shower head;Adaptive equipment Adaptive Equipment: Reacher;Long-handled sponge        Prior Functioning/Environment Prior Level of Function : History of Falls (last six months);Independent/Modified Independent             Mobility Comments: SPC for short distances, 2WW for longer distances, 1 fall (turned too quickly in kitchen, got 2 subdural hematomas) ADLs Comments: indep with basic ADL, med mgt, [facility] provides cleaning services and 3 meals/day at the dining room, pt indep with laundry. She does not drive since RLE pain, spouse drives        OT Problem List: Decreased strength;Pain;Impaired balance (sitting and/or standing);Decreased knowledge of use of DME or AE;Decreased activity tolerance      OT Treatment/Interventions: Self-care/ADL training;Therapeutic exercise;Therapeutic activities;DME and/or AE instruction;Patient/family education;Balance training;Energy conservation    OT Goals(Current goals can be found in the care plan section) Acute Rehab OT Goals Patient Stated Goal: go home OT Goal Formulation: With patient Time For Goal Achievement: 12/20/21 Potential to Achieve Goals: Good  OT Frequency: Min 2X/week    Co-evaluation              AM-PAC OT "6 Clicks" Daily Activity     Outcome Measure Help from  another person eating meals?: None Help from another person taking care of personal grooming?: A Little Help from another person toileting, which includes using toliet, bedpan, or urinal?: A Little Help from another person bathing (including washing, rinsing, drying)?: A Little Help from another person to put on and taking off regular upper body clothing?: None Help from another person to put on and taking off regular lower body clothing?: A Little 6 Click Score: 20   End of Session Equipment Utilized During Treatment: Rolling walker (2 wheels);Gait belt Nurse Communication: Mobility status;Other  (comment) (R knee pain with mobility, pt declined purewick, encouraged to call RN when she needs to use bathrooom)  Activity Tolerance: Patient tolerated treatment well;Patient limited by pain Patient left: in chair;with call bell/phone within reach  OT Visit Diagnosis: Other abnormalities of gait and mobility (R26.89);History of falling (Z91.81);Pain Pain - Right/Left: Right Pain - part of body: Knee                Time: 4403-4742 OT Time Calculation (min): 32 min Charges:  OT General Charges $OT Visit: 1 Visit OT Evaluation $OT Eval Moderate Complexity: 1 Mod OT Treatments $Self Care/Home Management : 23-37 mins  Arman Filter., MPH, MS, OTR/L ascom [phone] 12/06/21, 9:44 AM

## 2021-12-07 MED ORDER — ASPIRIN 81 MG PO TBEC: 81.0000 mg | DELAYED_RELEASE_TABLET | Freq: Every day | ORAL | 11 refills | Status: DC

## 2021-12-07 MED ORDER — DOCUSATE SODIUM 100 MG PO CAPS: 100.0000 mg | ORAL_CAPSULE | Freq: Every day | ORAL | 0 refills | Status: DC

## 2021-12-07 MED ORDER — OXYCODONE-ACETAMINOPHEN 5-325 MG PO TABS: 1.0000 | ORAL_TABLET | Freq: Four times a day (QID) | ORAL | 0 refills | Status: DC | PRN

## 2021-12-07 NOTE — Discharge Summary (Signed)
?Tracy Parsons    ?Discharge Summary ? ? ? ?Patient ID:  ?Tracy Parsons ?MRN: [id number] ?DOB/AGE: [birth year] 16 y.o. ? ?Admit date: 12/04/2021 ?Discharge date: 12/07/2021 ?Date of Surgery: 12/04/2021 ?Surgeon: Surgeon(s): ?Annice Needy, MD ? ?Admission Diagnosis: ?AAA (abdominal aortic aneurysm) without rupture [I71.40] ? ?Discharge Diagnoses:  ?AAA (abdominal aortic aneurysm) without rupture [I71.40] ? ?Secondary Diagnoses: ?Past Medical History:  ?Diagnosis Date  ? Anemia   ? Aortic atherosclerosis (HCC)   ? Atherosclerosis of native arteries of extremity with intermittent claudication (HCC)   ? CAD (coronary artery disease)   ? Depression   ? Diastolic dysfunction   ? a.) TTE 11/18/2021: EF 60-65%; triv MR; G1DD  ? Heart murmur   ? History of kidney stones   ? HLD (hyperlipidemia)   ? Hypertension   ? Hypothyroidism   ? Infrarenal abdominal aortic aneurysm (AAA) without rupture   ? a.) CTA 09/16/2021: infrarenal AAA with mural thrombus measuring 5.2 x 3.6 cm  ? Long term current use of antithrombotics/antiplatelets   ? a.) ASA + cilostazol  ? Normal pressure hydrocephalus (HCC)   ? a.) VP shunt in place  ? Osteoarthritis   ? PAD (peripheral artery disease) (HCC)   ? 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  ? 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? ? ?Procedure(s): ?ENDOVASCULAR REPAIR/STENT GRAFT ? ?Discharged Condition: good ? ?HPI:  ?Patient presented with 5.5cm AAA, Right Iliac occlusion. Admitted for elective EVAR and Iliac stenting on 12/04/2021 ? ?Hospital Course:  ?Tracy Parsons is a 76 y.o. female is S/P EVAR and Iliac stenting ?Procedure(s): ?ENDOVASCULAR REPAIR/STENT GRAFT ?Extubated: POD # 3 ?Physical exam:  Gen: Alert, NAD ?                            CV: RR ?                            Lungs; CTA ?                            ABD; Soft, NT/ND ?                           EXT: groin dressings soft, C/D/I, palpable DP, feet warm ?Post-op wounds healing well ?Pt. Ambulating, voiding and taking PO diet without difficulty. ?Pt pain controlled with PO pain meds. ?Labs as below ?Complications:none ? ?Consults:  ? ? ?Significant Diagnostic Studies: ?CBC ?Lab Results  ?Component Value Date  ? WBC 11.8 (H) 12/05/2021  ? HGB 8.4 (L) 12/05/2021  ? HCT 25.1 (L) 12/05/2021  ? MCV 90.6 12/05/2021  ? PLT 133 (L) 12/05/2021  ? ? ?BMET ?   ?Component Value Date/Time  ? NA 138 12/05/2021 0451  ? K 4.5 12/05/2021 0451  ? CL 106 12/05/2021 0451  ? CO2 25 12/05/2021 0451  ? GLUCOSE 151 (H) 12/05/2021 0451  ? BUN 16 12/05/2021 0451  ? CREATININE 1.00 12/05/2021 0451  ? CALCIUM 8.0 (L) 12/05/2021 0451  ? GFRNONAA 58 (L) 12/05/2021 0451  ? ?COAG ?No results found for: INR, PROTIME ? ? ?  Disposition:  ?Discharge to :Home ? ? ?Verbal and written Discharge instructions given to the patient. Wound care per Discharge AVS ? Follow-up Information   ? ? Annice Needy, MD. Schedule an appointment as soon as possible for a visit in 2 week(s).   ?Specialties: Vascular Surgery, Radiology, Interventional Cardiology ?Contact information: ?[address] ?[place] [address] ?[phone] ? ? ?  ?  ? ?  ?  ? ?  ? ? ?Signed: ?Bertram Denver, MD ? ?12/07/2021, 9:38 AM ? ? ?  ? ?

## 2021-12-07 NOTE — TOC Transition Note (Signed)
Transition of Care (TOC) - CM/SW Discharge Note ? ? ?Patient Details  ?Name: Tracy Parsons ?MRN: [id number] ?Date of Birth: 10/08/1945 ? ?Transition of Care (TOC) CM/SW Contact:  ?Bing Quarry, RN ?Phone Number: ?12/07/2021, 10:01 AM ? ? ?Clinical Narrative:   3/4: Admitted for elective EVAR and Iliac stenting on 12/04/2021. PT/OT evaluations recommended no needed follow up. [employer] Medicare insurance.  ?PCP Dr. Villa Herb.  ?RX: [id number] - [place] ([place]), [place] - [address] ?DC today. Gabriel Cirri RN CM  ? ? ? ?Final next level of care: Home/Self Care ?Barriers to Discharge: Barriers Resolved ? ? ?Patient Goals and CMS Choice ?  ?  ?Choice offered to / list presented to : NA ? ?Discharge Placement ?  ?           ?  ?  ?  ?  ? ?Discharge Plan and Services ?  ?  ?           ?DME Arranged: N/A ?DME Agency: NA ?  ?  ?  ?HH Arranged: NA ?HH Agency: NA ?  ?  ?  ? ?Social Determinants of Health (SDOH) Interventions ?  ? ? ?Readmission Risk Interventions ?No flowsheet data found. ? ? ? ? ?

## 2021-12-09 ENCOUNTER — Other Ambulatory Visit (INDEPENDENT_AMBULATORY_CARE_PROVIDER_SITE_OTHER): Payer: Self-pay | Admitting: Nurse Practitioner

## 2021-12-09 ENCOUNTER — Telehealth (INDEPENDENT_AMBULATORY_CARE_PROVIDER_SITE_OTHER): Payer: Self-pay

## 2021-12-09 MED ORDER — OXYCODONE-ACETAMINOPHEN 5-325 MG PO TABS: 1.0000 | ORAL_TABLET | Freq: Four times a day (QID) | ORAL | 0 refills | Status: AC | PRN

## 2021-12-09 MED ORDER — OXYCODONE-ACETAMINOPHEN 5-325 MG PO TABS: 1.0000 | ORAL_TABLET | Freq: Four times a day (QID) | ORAL | 0 refills | Status: DC | PRN

## 2021-12-09 NOTE — Telephone Encounter (Signed)
Patient was made aware

## 2021-12-09 NOTE — Telephone Encounter (Signed)
Patient stated that she has been taking 2 tablets every 4 hours due to the pain level. Patient is requesting for refill to be sent to pharmacy ?

## 2021-12-09 NOTE — Telephone Encounter (Signed)
Refill sent.

## 2021-12-09 NOTE — Telephone Encounter (Signed)
We typically do not sent in stronger pain medication as the Oxycodone 5-325 is the strongest pain medication we will send.  It is not unusual to have some increased pain as you are moving more at home.  She can take 2 pills instead of one for pain relief.  She should also continue to take aspirin 81 mg, and it can be OTC

## 2021-12-20 ENCOUNTER — Ambulatory Visit (INDEPENDENT_AMBULATORY_CARE_PROVIDER_SITE_OTHER): Payer: Medicare Other | Admitting: Internal Medicine

## 2021-12-20 ENCOUNTER — Encounter: Payer: Self-pay | Admitting: Internal Medicine

## 2021-12-20 VITALS — BP 104/62 | HR 96 | Temp 98.1°F | Resp 16 | Ht 60.5 in | Wt 112.5 lb

## 2021-12-20 DIAGNOSIS — I739 Peripheral vascular disease, unspecified: Secondary | ICD-10-CM

## 2021-12-20 DIAGNOSIS — E039 Hypothyroidism, unspecified: Secondary | ICD-10-CM

## 2021-12-20 DIAGNOSIS — F330 Major depressive disorder, recurrent, mild: Secondary | ICD-10-CM

## 2021-12-20 DIAGNOSIS — I714 Abdominal aortic aneurysm, without rupture, unspecified: Secondary | ICD-10-CM

## 2021-12-20 DIAGNOSIS — E7800 Pure hypercholesterolemia, unspecified: Secondary | ICD-10-CM

## 2021-12-20 DIAGNOSIS — I10 Essential (primary) hypertension: Secondary | ICD-10-CM | POA: Diagnosis not present

## 2021-12-20 DIAGNOSIS — Z982 Presence of cerebrospinal fluid drainage device: Secondary | ICD-10-CM

## 2021-12-20 DIAGNOSIS — G912 (Idiopathic) normal pressure hydrocephalus: Secondary | ICD-10-CM

## 2021-12-20 DIAGNOSIS — Z87442 Personal history of urinary calculi: Secondary | ICD-10-CM | POA: Insufficient documentation

## 2021-12-20 DIAGNOSIS — Z87828 Personal history of other (healed) physical injury and trauma: Secondary | ICD-10-CM

## 2021-12-20 NOTE — Progress Notes (Addendum)
? ?New Patient Office Visit ? ?Subjective:  ?Patient ID: Tracy Parsons, female    DOB: 12-20-1945  Age: 76 y.o. MRN: [id number] ? ?CC:  ?Chief Complaint  ?Patient presents with  ? Establish Care  ? ? ?HPI ?Tracy Parsons presents as a new patient. Moved to the area from [place] in January.  ? ?Hypertension: ?-Medications: Lisinopril 20 ?-Patient is compliant with above medications and reports no side effects. ?-Checking BP at home (average): not checking currently  ?-Denies any SOB, CP, vision changes, LE edema or symptoms of hypotension ? ?AAA/PAD: ?-CTA 12/22 showing AAA measuring 5.2 x 3.6 cm  ?-Underwent open endovascular repair with multiple stents placed 12/04/21 at [facility], recovering well. Following with vascular surgery next week ?-Currently on Cilostazol, aspirin ? ?HLD/CAD/PAD: ?-Medications: Lovastatin 40 ?-Patient is compliant with above medications and reports no side effects.  ?-Last lipid panel: 9/22 Triglycerides 109 - blood work done at last PCP in 1/23. TC 222, triglycerides 180, LDL 127, HDL 64. ? ?Hypothyroidism: ?-Medications: Levothyroxine 75, been on this dose for years ?-Patient is compliant with the above medication (s) at the above dose and reports no medication side effects.  ?-Denies weight changes, cold./heat intolerance, skin changes, anxiety/palpitations  ?-Last TSH: 1/23 2.830 ? 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 Small chronic subdural hematoma overlying left cerebral hemisphere measuring 0.6 cm which is decreased in size.  ? ?MDD: ?-Mood status: stable ?-Current treatment: Zoloft 200 mg  ?-Satisfied with current treatment?: yes ?-Symptom severity: mild   ?-Duration of current treatment : years ?-Side effects: no ?Medication compliance: excellent compliance ? ?Depression screen [facility] 2/9 12/20/2021  ?Decreased Interest 1  ?Down, Depressed, Hopeless 0  ?PHQ - 2 Score 1  ?Altered sleeping 0  ?Tired, decreased energy 2  ?Change in appetite 2  ?Feeling bad or failure about yourself  0  ?Trouble concentrating 0  ?Moving slowly or fidgety/restless 0  ?Suicidal thoughts 0  ?PHQ-9 Score 5  ?Difficult doing work/chores Somewhat difficult  ? ?Health Maintenance: ?-Blood work done in November, 2022, will fax records  ?-Due to patient request, all screenings such as mammograms, DEXA, colonoscopies will be discontinued  ?-Politely declines Prevnar 20 vaccine today ? ?Past Medical History:  ?Diagnosis Date  ? Anemia   ? Aortic atherosclerosis (HCC)   ? Atherosclerosis of native arteries of extremity with intermittent claudication (HCC)   ? CAD (coronary artery disease)   ? Depression   ? Diastolic dysfunction   ? a.) TTE 11/18/2021: EF 60-65%; triv MR; G1DD  ? Heart murmur   ? History of kidney stones   ? HLD (hyperlipidemia)   ? Hypertension   ? Hypothyroidism   ? Infrarenal abdominal aortic aneurysm (AAA) without rupture   ? a.) CTA 09/16/2021: infrarenal AAA with mural thrombus measuring 5.2 x 3.6 cm  ? Long term current use of antithrombotics/antiplatelets   ? a.) ASA + cilostazol  ? Normal pressure hydrocephalus (HCC)   ? a.) VP shunt in place  ? Osteoarthritis   ? PAD (peripheral artery disease) (HCC)   ? 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  ? 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? ? ?Past Surgical History:  ?Procedure Laterality Date  ? ABDOMINAL HYSTERECTOMY    ? ENDOVASCULAR REPAIR/STENT GRAFT N/A 12/04/2021  ? Procedure:  ENDOVASCULAR REPAIR/STENT GRAFT;  Surgeon: Annice Needy, MD;  Location: [facility] INVASIVE CV LAB;  Service: Cardiovascular;  Laterality: N/A;  ? EYE SURGERY Bilateral   ? NASAL SINUS SURGERY  1970  ? shunt in head  2022  ? ? ?No family history on file. ? ?Social History  ? ?Socioeconomic History  ? Marital status: Married  ?  Spouse name: Not on file  ? Number of children: Not on file  ? Years of education: Not on file  ? Highest education level: Not on file  ?Occupational History  ? Not on file  ?Tobacco Use  ? Smoking status: Every Day  ?  Packs/day: 0.25  ?  Types: Cigarettes  ? Smokeless tobacco: Never  ?Vaping Use  ? Vaping Use: Never used  ?Substance and Sexual Activity  ? Alcohol use: Yes  ?  Alcohol/week: 1.0 - 2.0 standard drink  ?  Types: 1 - 2 Glasses of wine per week  ? Drug use: Never  ? Sexual activity: Not on file  ?Other Topics Concern  ? Not on file  ?Social History Narrative  ? Not on file  ? ?Social Determinants of Health  ? ?Financial Resource Strain: Not on file  ?Food Insecurity: Not on file  ?Transportation Needs: Not on file  ?Physical Activity: Not on file  ?Stress: Not on file  ?Social Connections: Not on file  ?Intimate Partner Violence: Not on file  ? ? ?ROS ?Review of Systems  ?Constitutional:  Negative for chills and fever.  ?Eyes:  Negative for visual disturbance.  ?Respiratory:  Negative for cough.   ?Cardiovascular:  Negative for chest pain.  ?Gastrointestinal:  Negative for abdominal pain.  ?Neurological:  Negative for dizziness.  ? ?Objective:  ? ?Today's Vitals: BP 104/62   Pulse 96   Temp 98.1 ?F (36.7 ?C)   Resp 16   Ht 5' 0.5" (1.537 m)   Wt 112 lb 8 oz (51 kg)   SpO2 98%   BMI 21.61 kg/m?  ? ?Physical Exam ?Constitutional:   ?   Appearance: Normal appearance.  ?   Comments: Presents with walker  ?HENT:  ?   Head: Normocephalic and atraumatic.  ?   Mouth/Throat:  ?   Mouth: Mucous membranes are moist.  ?   Pharynx: Oropharynx is clear.  ?Eyes:  ?   Conjunctiva/sclera:  Conjunctivae normal.  ?Cardiovascular:  ?   Rate and Rhythm: Normal rate and regular rhythm.  ?Pulmonary:  ?   Effort: Pulmonary effort is normal.  ?   Breath sounds: Normal breath sounds.  ?Musculoskeletal:  ?   Right lower leg: No edema.  ?   Left lower leg: No edema.  ?Skin: ?   General: Skin is warm and dry.  ?   Comments: Incisions healing well, no signs of infection  ?Neurological:  ?   General: No focal deficit present.  ?   Mental Status: She is alert. Mental status is at baseline.  ?Psychiatric:     ?   Mood and Affect: Mood normal.     ?   Behavior: Behavior normal.  ? ? ?Assessment & Plan:  ? ?Problem List Items Addressed This  Visit   ? ?  ? Cardiovascular and Mediastinum  ? Essential hypertension - Primary  ?  Blood pressure low today, advised to start checking BP at home, she is already a fall risk. Recheck at follow up.  ?  ?  ? PAD (peripheral artery disease) (HCC)  ?  Recent stents placed, seeing Vascular, on Cilostazol, aspirin. ?  ?  ? AAA (abdominal aortic aneurysm) without rupture  ?  S/p endovascular repair with multiple stents 12/04/21, following with vascular next week, healing well.  ?  ?  ?  ? Endocrine  ? Hypothyroidism  ?  Continue Levothyroxine at current dose, requested last lab results from previous PCP, plan to recheck labs in May. ?  ?  ?  ? Nervous and Auditory  ? NPH (normal pressure hydrocephalus) (HCC)  ?  S/p VP shunt placed in April. Referral placed to Neurology.  ?  ?  ? Relevant Orders  ? Ambulatory referral to Neurology  ?  ? Other  ? Pure hypercholesterolemia  ?  On statin, requesting records of previous lipid panels.  ?  ?  ? S/P VP shunt  ? Relevant Orders  ? Ambulatory referral to Neurology  ? Hx of traumatic subdural hematoma  ?  Reviewed last CT scan in November, referral to Neurology.  ?  ?  ? Mild episode of recurrent major depressive disorder (HCC)  ?  Stable on Zoloft 200 mg.  ?  ?  ? ? ?Outpatient Encounter Medications as of 12/20/2021  ?Medication Sig  ? ascorbic  acid (VITAMIN C) 1000 MG tablet Take by mouth.  ? Aspirin 81 MG CAPS Take by mouth.  ? Calcium Citrate-Vitamin D3 1000-0.01 MG/30ML LIQD Take 2 tablets by mouth every morning.  ? cilostazol (PLETAL) 50 MG tablet Take 50 mg by

## 2021-12-20 NOTE — Assessment & Plan Note (Signed)
Reviewed last CT scan in November, referral to Neurology.  ?

## 2021-12-20 NOTE — Assessment & Plan Note (Signed)
On statin, requesting records of previous lipid panels.  ?

## 2021-12-20 NOTE — Assessment & Plan Note (Signed)
Blood pressure low today, advised to start checking BP at home, she is already a fall risk. Recheck at follow up.  ?

## 2021-12-20 NOTE — Assessment & Plan Note (Signed)
S/p endovascular repair with multiple stents 12/04/21, following with vascular next week, healing well.  ?

## 2021-12-20 NOTE — Assessment & Plan Note (Signed)
Recent stents placed, seeing Vascular, on Cilostazol, aspirin. ?

## 2021-12-20 NOTE — Patient Instructions (Addendum)
It was great seeing you today! ? ?Plan discussed at today's visit: ?-Please call your other PCP and have them fax [place] your records and most recent labs ?-Follow up with vascular surgery ?-Referral placed to Neurology  ? ?Follow up in: 2 months  ? ?Take care and let us know if you have any questions or concerns prior to your next visit. ? ?Dr. Caralee Ates ? ?

## 2021-12-20 NOTE — Assessment & Plan Note (Signed)
Stable on Zoloft 200 mg.  ?

## 2021-12-20 NOTE — Assessment & Plan Note (Signed)
Continue Levothyroxine at current dose, requested last lab results from previous PCP, plan to recheck labs in May. ?

## 2021-12-20 NOTE — Assessment & Plan Note (Signed)
S/p VP shunt placed in April. Referral placed to Neurology.  ?

## 2021-12-24 ENCOUNTER — Ambulatory Visit (INDEPENDENT_AMBULATORY_CARE_PROVIDER_SITE_OTHER): Payer: Medicare Other | Admitting: Vascular Surgery

## 2021-12-24 ENCOUNTER — Encounter (INDEPENDENT_AMBULATORY_CARE_PROVIDER_SITE_OTHER): Payer: Self-pay | Admitting: Vascular Surgery

## 2021-12-24 ENCOUNTER — Other Ambulatory Visit: Payer: Self-pay

## 2021-12-24 VITALS — BP 125/78 | HR 97 | Resp 16 | Ht 60.0 in | Wt 108.8 lb

## 2021-12-24 DIAGNOSIS — I10 Essential (primary) hypertension: Secondary | ICD-10-CM

## 2021-12-24 DIAGNOSIS — I714 Abdominal aortic aneurysm, without rupture, unspecified: Secondary | ICD-10-CM

## 2021-12-24 DIAGNOSIS — I70211 Atherosclerosis of native arteries of extremities with intermittent claudication, right leg: Secondary | ICD-10-CM

## 2021-12-24 NOTE — Assessment & Plan Note (Signed)
blood pressure control important in reducing the progression of atherosclerotic disease. On appropriate oral medications.  

## 2021-12-24 NOTE — Assessment & Plan Note (Signed)
Status post endovascular repair of her aneurysm as well as concomitant treatment for severe atherosclerotic occlusive disease.  Doing well.  EVAR duplex in 3 months as well as ABIs. ?

## 2021-12-24 NOTE — Progress Notes (Signed)
? ? ?Patient ID: Tracy Parsons, female   DOB: October 01, 1946, 76 y.o.   MRN: [id number] ? ?Chief Complaint  ?Patient presents with  ? Follow-up  ? ? ?HPI ?Tracy Parsons is a 76 y.o. female.  Patient returns 2 to 3 weeks after bilateral femoral artery cutdowns with right SFA endarterectomy and extensive iliac stents as well as an abdominal aortic aneurysm repair with a Gore excluder endoprosthesis.  She is doing well.  She has still some pain particular in the right groin and right medial thigh, but this is steadily improving.  Her activity has been improving.  She would like to begin physical therapy which I think is a great idea.  She denies any ulceration or infection.  Her wounds are healing well. ? ? ?Past Medical History:  ?Diagnosis Date  ? Anemia   ? Aortic atherosclerosis ([place])   ? Atherosclerosis of native arteries of extremity with intermittent claudication ([place])   ? CAD (coronary artery disease)   ? Depression   ? Diastolic dysfunction   ? a.) TTE 11/18/2021: EF 60-65%; triv MR; G1DD  ? Heart murmur   ? History of kidney stones   ? HLD (hyperlipidemia)   ? Hypertension   ? Hypothyroidism   ? Infrarenal abdominal aortic aneurysm (AAA) without rupture   ? a.) CTA 09/16/2021: infrarenal AAA with mural thrombus measuring 5.2 x 3.6 cm  ? Long term current use of antithrombotics/antiplatelets   ? a.) ASA + cilostazol  ? Normal pressure hydrocephalus (HCC)   ? a.) VP shunt in place  ? PAD (peripheral artery disease) ([place])   ? 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  ? 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? ? ?Past Surgical History:  ?Procedure Laterality Date  ? ABDOMINAL HYSTERECTOMY    ? ENDOVASCULAR REPAIR/STENT GRAFT N/A 12/04/2021  ? Procedure: ENDOVASCULAR  REPAIR/STENT GRAFT;  Surgeon: Algernon Huxley, MD;  Location: [facility];  Service: Cardiovascular;  Laterality: N/A;  ? EYE SURGERY Bilateral   ? NASAL SINUS SURGERY  1970  ? shunt in head  2022  ? ? ? ? ?Allergies  ?Allergen Reactions  ? Lipitor [Atorvastatin] Other (See Comments)  ?  Confusion ?  ? Sulfa Antibiotics Other (See Comments)  ?  Unknown reaction ?Unknown-- reaction as child ?  ? ? ?Current Outpatient Medications  ?Medication Sig Dispense Refill  ? ascorbic acid (VITAMIN C) 1000 MG tablet Take by mouth.    ? Aspirin 81 MG CAPS Take by mouth.    ? Calcium Citrate-Vitamin D3 1000-0.01 MG/30ML LIQD Take 2 tablets by mouth every morning.    ? cilostazol (PLETAL) 50 MG tablet Take 50 mg by mouth 2 (two) times daily.    ? levothyroxine (SYNTHROID) 75 MCG tablet Take 75 mcg by mouth daily.    ? lisinopril (ZESTRIL) 20 MG tablet Take 20 mg by mouth daily.    ? lovastatin (MEVACOR) 40 MG tablet Take 40 mg by mouth daily.    ? Multiple Vitamin (MULTIVITAMIN) tablet Take 1 tablet by mouth every morning.    ? sertraline (ZOLOFT) 100 MG tablet Take 100 mg by mouth in the morning and at bedtime.    ? ?No current facility-administered medications for this visit.  ? ? ? ? ? ? ?  Physical Exam ?BP 125/78 (BP Location: Right Arm)   Pulse 97   Resp 16   Ht 5' (1.524 m)   Wt 108 lb 12.8 oz (49.4 kg)   BMI 21.25 kg/m?  ?Gen:  WD/WN, NAD ?Skin: incisions C/D/I ?Vasc: Palpable pedal pulses bilaterally ? ? ? ? ?Assessment/Plan: ? ?AAA (abdominal aortic aneurysm) without rupture ?Status post endovascular repair of her aneurysm as well as concomitant treatment for severe atherosclerotic occlusive disease.  Doing well.  EVAR duplex in 3 months as well as ABIs.  Resume all normal activities as tolerated without restrictions. ? ?Atherosclerosis of native arteries of extremity with intermittent claudication ([place]) ?Status post endovascular repair of her aneurysm as well as concomitant treatment for severe atherosclerotic  occlusive disease.  Doing well.  EVAR duplex in 3 months as well as ABIs. ? ?Essential hypertension ?blood pressure control important in reducing the progression of atherosclerotic disease. On appropriate oral medications. ? ? ? ? ? ?Leotis Pain ?12/24/2021, 12:31 PM ? ? ?This note was created with Dragon medical transcription system.  Any errors from dictation are unintentional.    ?

## 2021-12-24 NOTE — Assessment & Plan Note (Signed)
Status post endovascular repair of her aneurysm as well as concomitant treatment for severe atherosclerotic occlusive disease.  Doing well.  EVAR duplex in 3 months as well as ABIs.  Resume all normal activities as tolerated without restrictions. ?

## 2021-12-26 ENCOUNTER — Other Ambulatory Visit: Payer: Self-pay | Admitting: Internal Medicine

## 2021-12-26 NOTE — Telephone Encounter (Signed)
Medication Refill - Medication: sertraline (ZOLOFT) 100 MG tablet ? ?cilostazol (PLETAL) 50 MG tablet [ ?Has the patient contacted their pharmacy? Yes.   ?(Agent: If no, request that the patient contact the pharmacy for the refill. If patient does not wish to contact the pharmacy document the reason why and proceed with request.) ?(Agent: If yes, when and what did the pharmacy advise?) ? ?Preferred Pharmacy (with phone number or street name):  ?[id number] - [place] ([place]), [place] - [address]  ?[address] [place] ([place] [address]  ?Phone: [phone] Fax: [phone]  ? ?Has the patient been seen for an appointment in the last year OR does the patient have an upcoming appointment? Yes.   ? ?Agent: Please be advised that RX refills may take up to 3 business days. We ask that you follow-up with your pharmacy. ? ?

## 2021-12-27 ENCOUNTER — Encounter: Payer: Self-pay | Admitting: Internal Medicine

## 2021-12-30 MED ORDER — CILOSTAZOL 50 MG PO TABS: 50.0000 mg | ORAL_TABLET | Freq: Two times a day (BID) | ORAL | 3 refills | Status: DC

## 2021-12-30 MED ORDER — SERTRALINE HCL 100 MG PO TABS: 100.0000 mg | ORAL_TABLET | Freq: Two times a day (BID) | ORAL | 3 refills | Status: DC

## 2021-12-30 NOTE — Telephone Encounter (Signed)
Requested medications are due for refill today.  Unsure ? ?Requested medications are on the active medications list.  yes ? ?Last refill. Cilostazol 06/25/2021, Zoloft 07/18/2021 ? ?Future visit scheduled.   yes ? ?Notes to clinic.  Lenward Chancellor is listed PCP. Medications are listed as historical. ? ? ? ?Requested Prescriptions  ?Pending Prescriptions Disp Refills  ? cilostazol (PLETAL) 50 MG tablet    ?  Sig: Take 1 tablet (50 mg total) by mouth 2 (two) times daily.  ?  ? Hematology: Antiplatelets - cilostazol Failed - 12/26/2021 11:05 PM  ?  ?  Failed - PLT in normal range and within 360 days  ?  Platelets  ?Date Value Ref Range Status  ?12/05/2021 133 (L) 150 - 400 K/uL Final  ?  Comment:  ?  Immature Platelet Fraction may be ?clinically indicated, consider ?ordering this additional test ?[id number] ?REPEATED TO VERIFY ?  ?  ?  ?  ?  Failed - WBC in normal range and within 360 days  ?  WBC  ?Date Value Ref Range Status  ?12/05/2021 11.8 (H) 4.0 - 10.5 K/uL Final  ?  ?  ?  ?  Passed - Valid encounter within last 6 months  ?  Recent Outpatient Visits   ? ?      ? 1 week ago Essential hypertension  ? [facility] Teodora Medici, DO  ? ?  ?  ?Future Appointments   ? ?        ? In 1 month Teodora Medici, [facility]  ? ?  ? ?  ?  ?  ? sertraline (ZOLOFT) 100 MG tablet    ?  Sig: Take 1 tablet (100 mg total) by mouth in the morning and at bedtime.  ?  ? Not Delegated - Psychiatry:  Antidepressants - SSRI - sertraline Failed - 12/26/2021 11:05 PM  ?  ?  Failed - This refill cannot be delegated  ?  ?  Passed - AST in normal range and within 360 days  ?  AST  ?Date Value Ref Range Status  ?10/31/2021 13 13 - 35 Final  ?  ?  ?  ?  Passed - ALT in normal range and within 360 days  ?  ALT  ?Date Value Ref Range Status  ?10/31/2021 11 7 - 35 U/L Final  ?  ?  ?  ?  Passed - Completed PHQ-2 or PHQ-9 in the last 360 days  ?  ?  Passed - Valid encounter within last 6 months  ?   Recent Outpatient Visits   ? ?      ? 1 week ago Essential hypertension  ? [facility] Teodora Medici, DO  ? ?  ?  ?Future Appointments   ? ?        ? In 1 month Teodora Medici, [facility]  ? ?  ? ?  ?  ?  ?  ?

## 2022-01-20 ENCOUNTER — Telehealth (INDEPENDENT_AMBULATORY_CARE_PROVIDER_SITE_OTHER): Payer: Self-pay

## 2022-01-27 ENCOUNTER — Telehealth (INDEPENDENT_AMBULATORY_CARE_PROVIDER_SITE_OTHER): Payer: Self-pay | Admitting: Vascular Surgery

## 2022-01-27 NOTE — Telephone Encounter (Signed)
Tracy Parsons, patient physical therapist called and stated that Ms. [name] has tingling in the right thigh and right knee and would like to see the provider.  Please advise. ?

## 2022-01-27 NOTE — Telephone Encounter (Signed)
Patient was made aware with medical recommendations and informed that she will leave appointment as schedule. Patient will contact the office if she has any new concerns ?

## 2022-01-27 NOTE — Telephone Encounter (Signed)
The tingling that she has is expected post surgery and can persist for several months.  She can come in earlier for scheduled studies as schedule allows in next couple weeks

## 2022-02-17 ENCOUNTER — Ambulatory Visit: Payer: Medicare Other | Admitting: Internal Medicine

## 2022-02-17 ENCOUNTER — Encounter: Payer: Self-pay | Admitting: Internal Medicine

## 2022-02-17 VITALS — BP 118/74 | HR 64 | Temp 98.5°F | Resp 16 | Ht 60.5 in | Wt 114.6 lb

## 2022-02-17 DIAGNOSIS — I10 Essential (primary) hypertension: Secondary | ICD-10-CM | POA: Diagnosis not present

## 2022-02-17 DIAGNOSIS — E039 Hypothyroidism, unspecified: Secondary | ICD-10-CM | POA: Diagnosis not present

## 2022-02-17 DIAGNOSIS — F330 Major depressive disorder, recurrent, mild: Secondary | ICD-10-CM | POA: Diagnosis not present

## 2022-02-17 DIAGNOSIS — I739 Peripheral vascular disease, unspecified: Secondary | ICD-10-CM | POA: Diagnosis not present

## 2022-02-17 DIAGNOSIS — H6123 Impacted cerumen, bilateral: Secondary | ICD-10-CM

## 2022-02-17 DIAGNOSIS — E7800 Pure hypercholesterolemia, unspecified: Secondary | ICD-10-CM

## 2022-02-17 MED ORDER — LOVASTATIN 40 MG PO TABS: 40.0000 mg | ORAL_TABLET | Freq: Every day | ORAL | 1 refills | Status: DC

## 2022-02-17 MED ORDER — LEVOTHYROXINE SODIUM 75 MCG PO TABS: 75.0000 ug | ORAL_TABLET | Freq: Every day | ORAL | 1 refills | Status: DC

## 2022-02-17 MED ORDER — SERTRALINE HCL 100 MG PO TABS: 100.0000 mg | ORAL_TABLET | Freq: Two times a day (BID) | ORAL | 1 refills | Status: DC

## 2022-02-17 MED ORDER — LISINOPRIL 20 MG PO TABS: 20.0000 mg | ORAL_TABLET | Freq: Every day | ORAL | 1 refills | Status: DC

## 2022-02-17 MED ORDER — CILOSTAZOL 50 MG PO TABS: 50.0000 mg | ORAL_TABLET | Freq: Two times a day (BID) | ORAL | 1 refills | Status: DC

## 2022-02-17 NOTE — Patient Instructions (Addendum)
It was great seeing you today! ? ?Plan discussed at today's visit: ?-Medications reordered today, being off of some of these medications can make you more tired so symptoms should be improving over the next few weeks of taking medications consistently  ?-Ears cleaned out today ?-Recommend making appointment with Neurology  ? ?Follow up in: 1 month  ? ?Take care and let us know if you have any questions or concerns prior to your next visit. ? ?Dr. Caralee Ates ? ?

## 2022-02-17 NOTE — Progress Notes (Signed)
? ?Acute Office Visit ? ?Subjective:  ? ?  ?Patient ID: Tracy Parsons, female    DOB: Feb 05, 1946, 76 y.o.   MRN: [id number] ? ?Chief Complaint  ?Patient presents with  ? Hearing Problem  ? Medication Refill  ?  BP, thyroid  ? ? ?HPI ?Patient is in today for decreased hearing and forgetfulness.  She is accompanied by her husband and daughter, who both state that she has been out of her blood pressure medicine, thyroid medication and Zoloft for the last several weeks.  There was some kind of mixup with the pharmacy, they were able to get all of her medications and she restarted on them last Wednesday.  Mini mental status exam score 29 today in the office. ? ?Hearing changes: ?-Sudden hearing changes over the last several weeks ?-Occurring in both ears ?-Denies ear pain, drainage from the ears or tinnitus ? ?Hypertension: ?-Medications: Lisinopril 20 mg ?-Patient is generally compliant with above medications but was out this medication for about 2 to 3 weeks.  She restarted taking it last week.  She reports no side effects. ?-Denies any SOB, CP, vision changes, LE edema or symptoms of hypotension ? ?Hypothyroidism: ?-Medications: Levothyroxine 75 mcg  ?-Again patient was out of her medication for 2 to 3 weeks and is feeling very fatigued, forgetful and will stare off into space according to her husband ?-Last TSH: 1/23, 2.83 ? ?MDD: ?-Mood status: uncontrolled ?-Current treatment: Zoloft 10 mg BID, had been off of it for several weeks, but now back on it on Wednesday  ?-Satisfied with current treatment?: yes ?-Symptom severity: moderate  ?-Side effects: no ? ? ?  02/17/2022  ?  3:03 PM 12/20/2021  ? 10:23 AM  ?Depression screen PHQ 2/9  ?Decreased Interest 3 1  ?Down, Depressed, Hopeless 2 0  ?PHQ - 2 Score 5 1  ?Altered sleeping 1 0  ?Tired, decreased energy 1 2  ?Change in appetite 1 2  ?Feeling bad or failure about yourself  0 0  ?Trouble concentrating 1 0  ?Moving slowly or fidgety/restless 1 0  ?Suicidal thoughts 0  0  ?PHQ-9 Score 10 5  ?Difficult doing work/chores Somewhat difficult Somewhat difficult  ? ?Review of Systems  ?Constitutional:  Negative for chills and fever.  ?HENT:  Positive for hearing loss. Negative for ear discharge, ear pain and tinnitus.   ?Respiratory:  Negative for shortness of breath.   ?Cardiovascular:  Negative for chest pain.  ?Neurological:  Negative for dizziness and headaches.  ? ? ?   ?Objective:  ?  ?BP 118/74   Pulse 64   Temp 98.5 ?F (36.9 ?C)   Resp 16   Ht 5' 0.5" (1.537 m)   Wt 114 lb 9.6 oz (52 kg)   SpO2 97%   BMI 22.01 kg/m?  ?BP Readings from Last 3 Encounters:  ?02/17/22 118/74  ?12/24/21 125/78  ?12/20/21 104/62  ? ?Wt Readings from Last 3 Encounters:  ?02/17/22 114 lb 9.6 oz (52 kg)  ?12/24/21 108 lb 12.8 oz (49.4 kg)  ?12/20/21 112 lb 8 oz (51 kg)  ? ?  ? ?Physical Exam ?Constitutional:   ?   Appearance: Normal appearance.  ?HENT:  ?   Head: Normocephalic and atraumatic.  ?   Right Ear: Ear canal and external ear normal. There is impacted cerumen.  ?   Left Ear: Ear canal and external ear normal. There is impacted cerumen.  ?Eyes:  ?   Conjunctiva/sclera: Conjunctivae normal.  ?Cardiovascular:  ?  Rate and Rhythm: Normal rate and regular rhythm.  ?Pulmonary:  ?   Effort: Pulmonary effort is normal.  ?   Breath sounds: Normal breath sounds.  ?Musculoskeletal:  ?   Right lower leg: No edema.  ?   Left lower leg: No edema.  ?Skin: ?   General: Skin is warm and dry.  ?Neurological:  ?   General: No focal deficit present.  ?   Mental Status: She is alert. Mental status is at baseline.  ?Psychiatric:     ?   Mood and Affect: Mood normal.     ?   Behavior: Behavior normal.  ? ? ?No results found for any visits on 02/17/22. ? ? ?   ?Assessment & Plan:  ? ?1. Essential hypertension: Blood pressure stable today, at goal.  Lisinopril 20 mg refilled today. ? ?- lisinopril (ZESTRIL) 20 MG tablet; Take 1 tablet (20 mg total) by mouth daily.  Dispense: 90 tablet; Refill: 1 ? ?2.  Hypothyroidism, unspecified type: I do think a lot of her forgetfulness is because she was out of both her levothyroxine and Zoloft.  Both of these were refilled today.  She will continue her levothyroxine 75 mcg daily.  She will follow-up in 1 month, if symptoms persist we will recheck thyroid function. ? ?- levothyroxine (SYNTHROID) 75 MCG tablet; Take 1 tablet (75 mcg total) by mouth daily.  Dispense: 90 tablet; Refill: 1 ? ?3. Mild episode of recurrent major depressive disorder [facility]): Zoloft refilled, continue 100 mg twice daily.  Recheck in 1 month. ? ?- sertraline (ZOLOFT) 100 MG tablet; Take 1 tablet (100 mg total) by mouth in the morning and at bedtime.  Dispense: 180 tablet; Refill: 1 ? ?4. PAD (peripheral artery disease) (HCC): Stable. Cilostazol refilled today. ? ?- cilostazol (PLETAL) 50 MG tablet; Take 1 tablet (50 mg total) by mouth 2 (two) times daily.  Dispense: 180 tablet; Refill: 1 ? ?5. Pure hypercholesterolemia: Chronic.  Stable.  Refill lovastatin 40 mg daily. ? ?- lovastatin (MEVACOR) 40 MG tablet; Take 1 tablet (40 mg total) by mouth daily.  Dispense: 90 tablet; Refill: 1 ? ?6. Bilateral impacted cerumen: Complaining of bilateral hearing changes today, both the ears with severe cerumen impaction.  Ears were successfully cleaned with warm water and she was able to hear in the office today.  Recheck in 4 weeks, if hearing remains decreased a referral to audiology will be placed. ? ? ?Return in about 4 weeks (around 03/17/2022) for please change appointment from next week to 1 month from now. ? ?Margarita Mail, DO ? ? ?

## 2022-02-25 ENCOUNTER — Ambulatory Visit: Payer: Medicare Other | Admitting: Internal Medicine

## 2022-02-27 DIAGNOSIS — R296 Repeated falls: Secondary | ICD-10-CM | POA: Insufficient documentation

## 2022-02-27 DIAGNOSIS — W19XXXA Unspecified fall, initial encounter: Secondary | ICD-10-CM | POA: Insufficient documentation

## 2022-03-07 ENCOUNTER — Emergency Department: Payer: Medicare Other

## 2022-03-07 ENCOUNTER — Emergency Department
Admission: EM | Admit: 2022-03-07 | Discharge: 2022-03-07 | Disposition: A | Discharge status: Home / Self Care | Payer: Medicare Other | Attending: Emergency Medicine | Admitting: Emergency Medicine

## 2022-03-07 ENCOUNTER — Other Ambulatory Visit: Payer: Self-pay

## 2022-03-07 ENCOUNTER — Encounter: Payer: Self-pay | Admitting: Emergency Medicine

## 2022-03-07 DIAGNOSIS — R519 Headache, unspecified: Secondary | ICD-10-CM | POA: Insufficient documentation

## 2022-03-07 DIAGNOSIS — F17200 Nicotine dependence, unspecified, uncomplicated: Secondary | ICD-10-CM | POA: Insufficient documentation

## 2022-03-07 DIAGNOSIS — I2510 Atherosclerotic heart disease of native coronary artery without angina pectoris: Secondary | ICD-10-CM | POA: Diagnosis not present

## 2022-03-07 DIAGNOSIS — R112 Nausea with vomiting, unspecified: Secondary | ICD-10-CM | POA: Insufficient documentation

## 2022-03-07 DIAGNOSIS — I5030 Unspecified diastolic (congestive) heart failure: Secondary | ICD-10-CM | POA: Insufficient documentation

## 2022-03-07 DIAGNOSIS — I110 Hypertensive heart disease with heart failure: Secondary | ICD-10-CM | POA: Diagnosis not present

## 2022-03-07 DIAGNOSIS — E039 Hypothyroidism, unspecified: Secondary | ICD-10-CM | POA: Insufficient documentation

## 2022-03-07 LAB — BASIC METABOLIC PANEL
Anion gap: 6 (ref 5–15)
BUN: 18 mg/dL (ref 8–23)
CO2: 25 mmol/L (ref 22–32)
Calcium: 9 mg/dL (ref 8.9–10.3)
Chloride: 106 mmol/L (ref 98–111)
Creatinine, Ser: 0.85 mg/dL (ref 0.44–1.00)
GFR, Estimated: >60 mL/min (ref >60)
Glucose, Bld: 117 mg/dL — ABNORMAL HIGH (ref 70–99)
Potassium: 3.8 mmol/L (ref 3.5–5.1)
Sodium: 137 mmol/L (ref 135–145)

## 2022-03-07 LAB — CBC WITH DIFFERENTIAL/PLATELET
Abs Immature Granulocytes: 0.04 10*3/uL (ref 0.00–0.07)
Basophils Absolute: 0 10*3/uL (ref 0.0–0.1)
Basophils Relative: 0 %
Eosinophils Absolute: 0.1 10*3/uL (ref 0.0–0.5)
Eosinophils Relative: 1 %
HCT: 37.1 % (ref 36.0–46.0)
Hemoglobin: 11.9 g/dL — ABNORMAL LOW (ref 12.0–15.0)
Immature Granulocytes: 1 %
Lymphocytes Relative: 16 %
Lymphs Abs: 1.4 10*3/uL (ref 0.7–4.0)
MCH: 30.6 pg (ref 26.0–34.0)
MCHC: 32.1 g/dL (ref 30.0–36.0)
MCV: 95.4 fL (ref 80.0–100.0)
Monocytes Absolute: 0.6 10*3/uL (ref 0.1–1.0)
Monocytes Relative: 7 %
Neutro Abs: 6.5 10*3/uL (ref 1.7–7.7)
Neutrophils Relative %: 75 %
Platelets: 298 10*3/uL (ref 150–400)
RBC: 3.89 MIL/uL (ref 3.87–5.11)
RDW: 15.3 % (ref 11.5–15.5)
WBC: 8.6 10*3/uL (ref 4.0–10.5)
nRBC: 0 % (ref 0.0–0.2)

## 2022-03-07 NOTE — ED Triage Notes (Signed)
Pt to ED via [facility] from home for headache that woke her up this morning. Pt has also been vomiting this morning. Pt has hx/o subdural bleed x 2. Pt also has brain shunt. Pt is currently in NAD.

## 2022-03-07 NOTE — ED Provider Notes (Signed)
[facility] Provider Note    Event Date/Time   First MD Initiated Contact with Patient 03/07/22 1131     (approximate)   History   Headache (Hx/o subdural bleed) and Emesis   HPI  Tracy Parsons is a 76 y.o. 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  Patient says she woke up around 6 AM today with right-sided headache has been rather constant since onset.  Denies visual change numbness tingling weakness.  She also had several episodes of emesis denies ongoing nausea.  She says she does not frequently get headaches.  She has a history of a VP shunt in place denies fevers or chills.  Shunt was placed in April 2022.  Denies other symptoms including chest pain shortness of breath.  Her physical therapist encouraged her to come to the ED.     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   Traumatic subdural hemorrhage with loss of consciousness status unknown, initial encounter ([place]) 07/02/2021   Abdominal aortic aneurysm (AAA) without rupture ([place]) 03/20/2021   Atherosclerosis of native arteries of extremity with intermittent claudication ([place]) 03/20/2021   Essential hypertension 03/20/2021   PAD (peripheral artery disease) ([place]) 03/20/2021   Pure hypercholesterolemia 03/20/2021   Tobacco abuse 03/20/2021   NPH (normal pressure hydrocephalus) ([place]) 01/08/2021     Physical Exam  Triage Vital Signs: ED Triage Vitals  Enc Vitals Group     BP 03/07/22 1110 (!) 168/80     Pulse Rate 03/07/22 1110 77     Resp 03/07/22 1110 16     Temp 03/07/22 1110 97.9 F (36.6 C)     Temp src --      SpO2 03/07/22 1110 90 %     Weight 03/07/22 1111 112 lb 7  oz (51 kg)     Height 03/07/22 1111 5' (1.524 m)     Head Circumference --      Peak Flow --      Pain Score 03/07/22 1110 2     Pain Loc --      Pain Edu? --      Excl. in [place]? --     Most recent vital signs: Vitals:   03/07/22 1137 03/07/22 1230  BP:  (!) 148/93  Pulse: 72 70  Resp:  19  Temp:    SpO2: 100% 100%     General: Awake, no distress.  Chronically ill-appearing CV:  Good peripheral perfusion.  Resp:  Normal effort.  Abd:  No distention.  Neuro:             Awake, Alert, Oriented x 3  Other:  Aox3, nml speech   PERRL, EOMI, face symmetric, nml tongue movement  5/5 strength in the BL upper and lower extremities  Sensation grossly intact in the BL upper and lower extremities  Finger-nose-finger intact BL  The patient placed right posterior head no overlying skin change   ED Results / Procedures / Treatments  Labs (all labs ordered are listed, but only abnormal results are displayed) Labs Reviewed  CBC WITH DIFFERENTIAL/PLATELET - Abnormal; Notable for the following components:      Result Value   Hemoglobin 11.9 (*)    All other components within normal limits  BASIC METABOLIC PANEL - Abnormal; Notable for the following components:   Glucose, Bld 117 (*)    All other components within normal limits     EKG     RADIOLOGY I reviewed and interpreted the CT head which shows ventriculomegaly   PROCEDURES:  Critical Care performed: Yes, see critical care procedure note(s)  Procedures  The patient is on the cardiac monitor to evaluate for evidence of arrhythmia and/or significant heart rate changes.   MEDICATIONS ORDERED IN ED: Medications - No data to display   IMPRESSION / MDM / [place] / ED COURSE  I reviewed the triage vital signs and the nursing notes.                              Patient's presentation is most consistent with acute presentation with potential threat to life or bodily function.  Differential diagnosis includes, but is not limited to, intracranial hemorrhage, subdural, meningitis, migraine, worsening hydrocephalus  Patient is a 76 year old female with history of NPH status post VP shunt also history of subdurals requiring craniectomy who presents today with headache nausea and vomiting.  This awoke her today around 6 AM.  She has no associated neurologic symptoms.  Of note patient has had some worsening gait and urinary incontinence over several months and had a telehealth visit with her surgeon who recommended outpatient CT head.  However she  has no other acute neurologic symptoms.  She is not having fevers.  Really does not get headaches typically.  She looks well but is chronically ill-appearing on my exam.  Neurologic exam is nonfocal patient does not have any overlying skin changes.  CT head demonstrates ventriculomegaly despite VP shunt being in place.  There is also some questionable subdural 2 mm versus just some subdural thickening.  Discussed with Dr. Tamala Julian with neurosurgery who notes that if the VP shunt fails and NPH it is just likely to cause recurrent symptoms but it should not cause headache or  vomiting like it would in someone who has this for obstructive hydrocephalus.  He did recommend repeating the CT head in 6 hours given his potential subdural to ensure its not changing.  Patient did have a fall 2 weeks ago but no other trauma.  We will repeat head at 6 PM.  Patient signed out to oncoming provider pending repeat imaging.  Repeat assessment patient is feeling well does not want anything for pain or nausea.       FINAL CLINICAL IMPRESSION(S) / ED DIAGNOSES   Final diagnoses:  Nonintractable headache, unspecified chronicity pattern, unspecified headache type     Rx / DC Orders   ED Discharge Orders     None        Note:  This document was prepared using Dragon voice recognition software and may include unintentional dictation errors.   Rada Hay, MD 03/07/22 [phone]

## 2022-03-07 NOTE — Discharge Instructions (Addendum)
Your CT scans in the ED today do not show any enlarging bleeding or other concerning findings. Please follow up with your neurosurgeon in the office for continued evaluation of your symptoms.

## 2022-03-14 ENCOUNTER — Telehealth: Payer: Self-pay | Admitting: Internal Medicine

## 2022-03-14 NOTE — Telephone Encounter (Unsigned)
Copied from [place] [id number]. Topic: General - Other >> Mar 14, 2022  2:39 PM Marcellus Scott wrote: Reason for CRM: Gery Pray from [facility] sent an updated plan of care and evaluation for pt on June 6th at6:30 and is calling back requesting an update.  Andee Poles mentioned she was going to resend today.

## 2022-03-14 NOTE — Telephone Encounter (Signed)
Did u receive anything on her?

## 2022-03-18 ENCOUNTER — Ambulatory Visit (INDEPENDENT_AMBULATORY_CARE_PROVIDER_SITE_OTHER): Payer: Medicare Other | Admitting: Internal Medicine

## 2022-03-18 ENCOUNTER — Encounter: Payer: Self-pay | Admitting: Internal Medicine

## 2022-03-18 VITALS — BP 112/68 | HR 86 | Temp 97.6°F | Resp 16 | Ht 60.5 in | Wt 112.9 lb

## 2022-03-18 DIAGNOSIS — I739 Peripheral vascular disease, unspecified: Secondary | ICD-10-CM | POA: Diagnosis not present

## 2022-03-18 DIAGNOSIS — I10 Essential (primary) hypertension: Secondary | ICD-10-CM | POA: Diagnosis not present

## 2022-03-18 DIAGNOSIS — L989 Disorder of the skin and subcutaneous tissue, unspecified: Secondary | ICD-10-CM

## 2022-03-18 DIAGNOSIS — E7800 Pure hypercholesterolemia, unspecified: Secondary | ICD-10-CM

## 2022-03-18 DIAGNOSIS — E039 Hypothyroidism, unspecified: Secondary | ICD-10-CM | POA: Diagnosis not present

## 2022-03-18 DIAGNOSIS — G912 (Idiopathic) normal pressure hydrocephalus: Secondary | ICD-10-CM

## 2022-03-18 DIAGNOSIS — F330 Major depressive disorder, recurrent, mild: Secondary | ICD-10-CM

## 2022-03-18 MED ORDER — CILOSTAZOL 50 MG PO TABS: 50.0000 mg | ORAL_TABLET | Freq: Two times a day (BID) | ORAL | 1 refills | Status: DC

## 2022-03-18 NOTE — Assessment & Plan Note (Signed)
Moods stable, doing well now that she is back on Zoloft 200 mg. Continue current medications.

## 2022-03-18 NOTE — Assessment & Plan Note (Signed)
Continue Cilostazol, aspirin. Refills sent. Following with vascular surgery in July. Doing well with PT, no claudication symptoms.

## 2022-03-18 NOTE — Assessment & Plan Note (Signed)
Continue Levothyroxine 75 mcg. Doing much better now that she's back on her daily dosing. Plan to recheck labs at follow up.

## 2022-03-18 NOTE — Assessment & Plan Note (Deleted)
Continue Cilostazol, aspirin. Refills sent. Following with vascular surgery in July.

## 2022-03-18 NOTE — Assessment & Plan Note (Signed)
Reviewed lipid panel with the patient from January. Continue Lovastatin 40 mg. Plan to recheck fasting labs at follow up.

## 2022-03-18 NOTE — Assessment & Plan Note (Addendum)
Seen in ER on 03/07/22 for headache. Ultimately head CT showing unchanged thin left cerebral convexity subdural thickening vs minimal subdural hematoma without associated mass effect. Stable hydrocephalus with ventriculostomy catheter in place. Neurosurgeon on call reviewed the case and she was discharged. Recommend discussing results with patient's Neurosurgeon she's been following with. Headache resolved. No neurologic symptoms today.

## 2022-03-18 NOTE — Progress Notes (Signed)
Established Patient Office Visit  Subjective:  Patient ID: Tracy Parsons, female    DOB: Apr 16, 1946  Age: 76 y.o. MRN: [id number]  CC:  Chief Complaint  Patient presents with   Follow-up    HPI Illianna Parsons presents for follow up on chronic medical conditions.   Hypertension: -Medications: Lisinopril 20 -Patient is compliant with above medications and reports no side effects. -Checking BP at home (average): not checking currently  -Denies any SOB, CP, vision changes, LE edema or symptoms of hypotension  AAA/PAD: -CTA 12/22 showing AAA measuring 5.2 x 3.6 cm  -Underwent open endovascular repair with multiple stents placed 12/04/21 at [facility], recovering well. Following with vascular surgery in July.  -Currently on Cilostazol, aspirin -Working with physical therapy short walks, exercise bike and doing well   HLD/CAD/PAD: -Medications: Lovastatin 40 -Patient is compliant with above medications and reports no side effects.  -Last lipid panel: 9/22 Triglycerides 109 - blood work done at last PCP in 1/23. TC 222, triglycerides 180, LDL 127, HDL 64.  Hypothyroidism: -Medications: Levothyroxine 75, been on this dose for years -Patient is compliant with the above medication (s) at the above dose and reports no medication side effects.  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 Small chronic subdural hematoma overlying left cerebral hemisphere measuring 0.6 cm which is decreased in size.  -Was seen in the ER 03/07/22 for headache. Had 2 head CT's, first showed  moderate hydrocephalus despite ventriculostomy catheter in place. Trace 2 mm thick left cerebral convexity subdural thickening vs. Subdural hematoma, possibly chronic with patchy periventricular white matter hypo-densities nonspecific possible representing periventricular edema vs chronic microvascular disease. Second a few hours later showed unchanged thin left cerebral convexity subdural thickening vs minimal subdural hematoma without associated mass effect. Stable hydrocephalus with ventriculostomy catheter in place.   MDD: -Mood status: stable -Current treatment: Zoloft 200 mg  -Satisfied with current treatment?: yes -Symptom severity: mild  -Duration of current treatment : years -Side effects: no Medication compliance: excellent compliance     03/18/2022    1:06 PM 02/17/2022    3:03 PM 12/20/2021   10:23 AM  Depression screen PHQ 2/9  Decreased Interest 0 3 1  Down, Depressed, Hopeless 0 2 0  PHQ - 2 Score 0 5 1  Altered sleeping 0 1 0  Tired, decreased energy 0 1 2  Change in appetite 0 1 2  Feeling bad or failure about yourself  0 0 0  Trouble concentrating 0 1 0  Moving slowly or fidgety/restless 0 1 0  Suicidal thoughts 0 0 0  PHQ-9 Score 0 10 5  Difficult doing work/chores Not difficult at all Somewhat difficult Somewhat difficult   Health Maintenance: -Blood work done in November, 2022, will fax records  -Due to patient request, all screenings such as mammograms, DEXA, colonoscopies will be discontinued  -Politely declines Prevnar 20 vaccine today  Past Medical History:  Diagnosis Date   Anemia    Aortic atherosclerosis (HCC)    Atherosclerosis of native arteries of extremity with intermittent claudication (HCC)    CAD (coronary artery disease)    Depression  Diastolic dysfunction    a.) TTE 11/18/2021: EF 60-65%; triv MR; G1DD   Heart murmur    History of kidney stones    HLD (hyperlipidemia)    Hypertension    Hypothyroidism    Infrarenal abdominal aortic  aneurysm (AAA) without rupture (HCC)    a.) CTA 09/16/2021: infrarenal AAA with mural thrombus measuring 5.2 x 3.6 cm   Long term current use of antithrombotics/antiplatelets    a.) ASA + cilostazol   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[facility](HCC)     Past Surgical History:  Procedure Laterality Date   ABDOMINAL HYSTERECTOMY     ENDOVASCULAR REPAIR/STENT GRAFT N/A 12/04/2021   Procedure: ENDOVASCULAR REPAIR/STENT GRAFT;  Surgeon: Annice Needyew, Jason S, MD;  Location: [facility] INVASIVE CV LAB;  Service: Cardiovascular;  Laterality: N/A;   EYE SURGERY Bilateral    NASAL SINUS SURGERY  1970   shunt in head  2022    Family History  Adopted: Yes    Social History   Socioeconomic History   Marital status: Married    Spouse name: Not on file   Number of children: Not on file   Years of education: Not on file   Highest education level: Not on file  Occupational History   Not on file  Tobacco Use   Smoking status: Every Day    Packs/day: 0.25    Types: Cigarettes   Smokeless tobacco: Never  Vaping Use   Vaping Use: Never used  Substance and Sexual Activity   Alcohol use: Yes    Alcohol/week: 1.0 - 2.0 standard drink of alcohol    Types: 1 - 2 Glasses of wine per week   Drug use: Never   Sexual activity: Not Currently  Other Topics Concern   Not on file  Social History Narrative   Not on file   Social Determinants of Health   Financial Resource Strain: Not on file  Food Insecurity: Not on file  Transportation Needs: Not on file  Physical Activity: Not on file  Stress: Not on file  Social  Connections: Not on file  Intimate Partner Violence: Not on file    ROS Review of Systems  Constitutional:  Negative for chills and fever.  Eyes:  Negative for visual disturbance.  Respiratory:  Negative for cough.   Cardiovascular:  Negative for chest pain.  Gastrointestinal:  Negative for abdominal pain.  Neurological:  Negative for dizziness.    Objective:   Today's Vitals: BP 112/68   Pulse 86   Temp 97.6 F (36.4 C)   Resp 16   Ht 5' 0.5" (1.537 m)   Wt 112 lb 14.4 oz (51.2 kg)   SpO2 97%   BMI 21.69 kg/m   Physical Exam Constitutional:      Appearance: Normal appearance.     Comments: Presents with walker  HENT:     Head: Normocephalic and atraumatic.     Mouth/Throat:     Mouth: Mucous membranes are moist.     Pharynx: Oropharynx is clear.  Eyes:     Conjunctiva/sclera: Conjunctivae normal.  Cardiovascular:  Rate and Rhythm: Normal rate and regular rhythm.  Pulmonary:     Effort: Pulmonary effort is normal.     Breath sounds: Normal breath sounds.  Musculoskeletal:     Right lower leg: No edema.     Left lower leg: No edema.  Skin:    General: Skin is warm and dry.     Comments: 3x5 mm irregular skin lesion on right anterior thigh  Neurological:     General: No focal deficit present.     Mental Status: She is alert. Mental status is at baseline.  Psychiatric:        Mood and Affect: Mood normal.        Behavior: Behavior normal.     Assessment & Plan:   Problem List Items Addressed This Visit       Cardiovascular and Mediastinum   Essential hypertension - Primary    Chronic and stable. Continue Lisinopril 20 mg. Continue to monitor.       PAD (peripheral artery disease) (HCC)    Continue Cilostazol, aspirin. Refills sent. Following with vascular surgery in July. Doing well with PT, no claudication symptoms.       Relevant Medications   cilostazol (PLETAL) 50 MG tablet     Endocrine   Hypothyroidism    Continue Levothyroxine 75  mcg. Doing much better now that she's back on her daily dosing. Plan to recheck labs at follow up.        Nervous and Auditory   NPH (normal pressure hydrocephalus) (HCC)    Seen in ER on 03/07/22 for headache. Ultimately head CT showing unchanged thin left cerebral convexity subdural thickening vs minimal subdural hematoma without associated mass effect. Stable hydrocephalus with ventriculostomy catheter in place. Neurosurgeon on call reviewed the case and she was discharged. Recommend discussing results with patient's Neurosurgeon she's been following with. Headache resolved. No neurologic symptoms today.         Other   Pure hypercholesterolemia    Reviewed lipid panel with the patient from January. Continue Lovastatin 40 mg. Plan to recheck fasting labs at follow up.      Mild episode of recurrent major depressive disorder (HCC)    Moods stable, doing well now that she is back on Zoloft 200 mg. Continue current medications.       Other Visit Diagnoses     Skin lesion of right leg    - Skin lesion on anterior thigh that patient has had for years but it has been slowly getting bigger, irregular borders. Referral placed to Dermatology for potential biopsy.    Relevant Orders   Ambulatory referral to Dermatology      Outpatient Encounter Medications as of 03/18/2022  Medication Sig   ascorbic acid (VITAMIN C) 1000 MG tablet Take by mouth.   Aspirin 81 MG CAPS Take by mouth.   Calcium Citrate-Vitamin D3 1000-0.01 MG/30ML LIQD Take 2 tablets by mouth every morning.   levothyroxine (SYNTHROID) 75 MCG tablet Take 1 tablet (75 mcg total) by mouth daily.   lisinopril (ZESTRIL) 20 MG tablet Take 1 tablet (20 mg total) by mouth daily.   lovastatin (MEVACOR) 40 MG tablet Take 1 tablet (40 mg total) by mouth daily.   Multiple Vitamin (MULTIVITAMIN) tablet Take 1 tablet by mouth every morning.   sertraline (ZOLOFT) 100 MG tablet Take 1 tablet (100 mg total) by mouth in the morning and at  bedtime.   [DISCONTINUED] cilostazol (PLETAL) 50 MG tablet Take 1 tablet (50 mg total)  by mouth 2 (two) times daily.   cilostazol (PLETAL) 50 MG tablet Take 1 tablet (50 mg total) by mouth 2 (two) times daily.   No facility-administered encounter medications on file as of 03/18/2022.    Follow-up: Return in about 6 months (around 09/17/2022).   Margarita Mail, DO

## 2022-03-18 NOTE — Assessment & Plan Note (Signed)
Chronic and stable. Continue Lisinopril 20 mg. Continue to monitor.

## 2022-03-19 NOTE — Telephone Encounter (Signed)
Tracy Parsons , spouse has called and states they are still waiting for an eval on his wife. He states that has not had an evaluation but someone from [facility] is doing physical therapy but pt needs eval for OT> his contact # for questions is [phone], seems to be confusion.

## 2022-03-19 NOTE — Telephone Encounter (Signed)
Spoke w/ danielle will send an order

## 2022-03-20 ENCOUNTER — Other Ambulatory Visit: Payer: Self-pay | Admitting: Internal Medicine

## 2022-03-20 DIAGNOSIS — I739 Peripheral vascular disease, unspecified: Secondary | ICD-10-CM

## 2022-03-20 NOTE — Telephone Encounter (Signed)
Requested Prescriptions  Pending Prescriptions Disp Refills  . cilostazol (PLETAL) 50 MG tablet [Pharmacy Med Name: CILOSTAZOL 50 MG TAB] 180 tablet 1    Sig: TAKE ONE (1) TABLET BY MOUTH TWO TIMES PER DAY     Hematology: Antiplatelets - cilostazol Passed - 03/20/2022 10:25 AM      Passed - PLT in normal range and within 360 days    Platelets  Date Value Ref Range Status  03/07/2022 298 150 - 400 K/uL Final         Passed - WBC in normal range and within 360 days    WBC  Date Value Ref Range Status  03/07/2022 8.6 4.0 - 10.5 K/uL Final         Passed - Valid encounter within last 6 months    Recent Outpatient Visits          2 days ago Essential hypertension   [facility] Margarita Mail, DO   1 month ago Essential hypertension   [facility] Margarita Mail, DO   3 months ago Essential hypertension   [facility] Margarita Mail, DO      Future Appointments            In 6 months Margarita Mail, DO [facility]   In 6 months Deirdre Evener, MD [facility]

## 2022-03-31 ENCOUNTER — Telehealth: Payer: Self-pay | Admitting: Internal Medicine

## 2022-04-01 ENCOUNTER — Ambulatory Visit (INDEPENDENT_AMBULATORY_CARE_PROVIDER_SITE_OTHER): Payer: Medicare Other

## 2022-04-01 ENCOUNTER — Ambulatory Visit (INDEPENDENT_AMBULATORY_CARE_PROVIDER_SITE_OTHER): Payer: Medicare Other | Admitting: Vascular Surgery

## 2022-04-01 ENCOUNTER — Encounter (INDEPENDENT_AMBULATORY_CARE_PROVIDER_SITE_OTHER): Payer: Self-pay | Admitting: Vascular Surgery

## 2022-04-01 VITALS — BP 84/53 | HR 86 | Resp 16 | Ht 60.0 in | Wt 113.0 lb

## 2022-04-01 DIAGNOSIS — I714 Abdominal aortic aneurysm, without rupture, unspecified: Secondary | ICD-10-CM

## 2022-04-01 DIAGNOSIS — I70211 Atherosclerosis of native arteries of extremities with intermittent claudication, right leg: Secondary | ICD-10-CM

## 2022-04-01 DIAGNOSIS — I10 Essential (primary) hypertension: Secondary | ICD-10-CM | POA: Diagnosis not present

## 2022-04-01 DIAGNOSIS — E7800 Pure hypercholesterolemia, unspecified: Secondary | ICD-10-CM

## 2022-04-01 NOTE — Assessment & Plan Note (Signed)
Her ABIs are 1.20 on the right and 1.08 on the left with triphasic waveforms and good digital waveforms and pressures bilaterally.  Although her legs are still somewhat weak and deconditioned, her perfusion is now intact and she should continue physical therapy and try to improve her ambulation and activity over time.  Recheck ABIs in 6 months.  Continue current medical regimen.

## 2022-05-09 ENCOUNTER — Encounter: Payer: Self-pay | Admitting: Internal Medicine

## 2022-05-24 ENCOUNTER — Other Ambulatory Visit: Payer: Self-pay | Admitting: Internal Medicine

## 2022-05-24 DIAGNOSIS — F330 Major depressive disorder, recurrent, mild: Secondary | ICD-10-CM

## 2022-05-26 NOTE — Telephone Encounter (Signed)
Change of pharmacy Requested Prescriptions  Pending Prescriptions Disp Refills  . sertraline (ZOLOFT) 100 MG tablet [Pharmacy Med Name: SERTRALINE HCL 100 MG TAB] 180 tablet 0    Sig: TAKE 1 TABLET BY MOUTH IN THE MORNING AND AT BEDTIME     Psychiatry:  Antidepressants - SSRI - sertraline Passed - 05/24/2022 10:29 AM      Passed - AST in normal range and within 360 days    AST  Date Value Ref Range Status  10/31/2021 13 13 - 35 Final         Passed - ALT in normal range and within 360 days    ALT  Date Value Ref Range Status  10/31/2021 11 7 - 35 U/L Final         Passed - Completed PHQ-2 or PHQ-9 in the last 360 days      Passed - Valid encounter within last 6 months    Recent Outpatient Visits          2 months ago Essential hypertension   [facility] Margarita Mail, DO   3 months ago Essential hypertension   [facility] Margarita Mail, DO   5 months ago Essential hypertension   [facility] Margarita Mail, DO      Future Appointments            In 3 months Margarita Mail, DO [facility], [facility]   In 4 months Deirdre Evener, MD [facility]

## 2022-06-26 ENCOUNTER — Ambulatory Visit (INDEPENDENT_AMBULATORY_CARE_PROVIDER_SITE_OTHER): Payer: Medicare Other

## 2022-06-26 DIAGNOSIS — Z0000 Encounter for general adult medical examination without abnormal findings: Secondary | ICD-10-CM

## 2022-06-26 NOTE — Progress Notes (Signed)
Subjective:  I connected with  Tracy Parsons on 06/26/22 by a audio enabled telemedicine application and verified that I am speaking with the correct person using two identifiers.  Patient Location: Home  Provider Location: Office/Clinic  I discussed the limitations of evaluation and management by telemedicine. The patient expressed understanding and agreed to proceed. Tracy Parsons is a 76 y.o. female who presents for Medicare Annual (Subsequent) preventive examination.  Review of Systems    Defer to PCP Cardiac Risk Factors include: advanced age (>22men, >14 women)     Objective:    There were no vitals filed for this visit. There is no height or weight on file to calculate BMI.     06/26/2022   10:55 AM 03/07/2022   11:12 AM 12/04/2021    7:43 AM 11/25/2021    1:51 PM  Advanced Directives  Does Patient Have a Medical Advance Directive? No Yes No Yes  Type of Special educational needs teacher of [place];Living will  Healthcare Power of [place];Living will  Would patient like information on creating a medical advance directive? No - Patient declined  No - Patient declined     Current Medications (verified) Outpatient Encounter Medications as of 06/26/2022  Medication Sig   ascorbic acid (VITAMIN C) 1000 MG tablet Take by mouth.   Aspirin 81 MG CAPS Take by mouth.   Calcium Citrate-Vitamin D3 1000-0.01 MG/30ML LIQD Take 2 tablets by mouth every morning.   cilostazol (PLETAL) 50 MG tablet TAKE ONE (1) TABLET BY MOUTH TWO TIMES PER DAY   levothyroxine (SYNTHROID) 75 MCG tablet Take 1 tablet (75 mcg total) by mouth daily.   lisinopril (ZESTRIL) 20 MG tablet Take 1 tablet (20 mg total) by mouth daily.   lovastatin (MEVACOR) 40 MG tablet Take 1 tablet (40 mg total) by mouth daily.   Multiple Vitamin (MULTIVITAMIN) tablet Take 1 tablet by mouth every morning.   sertraline (ZOLOFT) 100 MG tablet TAKE 1 TABLET BY MOUTH IN THE MORNING AND AT BEDTIME   No facility-administered  encounter medications on file as of 06/26/2022.    Allergies (verified) Lipitor [atorvastatin] and Sulfa antibiotics   History: Past Medical History:  Diagnosis Date   Anemia    Aortic atherosclerosis (HCC)    Atherosclerosis of native arteries of extremity with intermittent claudication (HCC)    CAD (coronary artery disease)    Depression    Diastolic dysfunction    a.) TTE 11/18/2021: EF 60-65%; triv MR; G1DD   Heart murmur    History of kidney stones    HLD (hyperlipidemia)    Hypertension    Hypothyroidism    Infrarenal abdominal aortic aneurysm (AAA) without rupture (HCC)    a.) CTA 09/16/2021: infrarenal AAA with mural thrombus measuring 5.2 x 3.6 cm   Long term current use of antithrombotics/antiplatelets    a.) ASA + cilostazol   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[facility])    Past Surgical History:  Procedure Laterality Date   ABDOMINAL HYSTERECTOMY     ENDOVASCULAR REPAIR/STENT GRAFT N/A 12/04/2021   Procedure: ENDOVASCULAR REPAIR/STENT GRAFT;  Surgeon: Wyn Quaker,  Marlow Baars, MD;  Location: [facility] INVASIVE CV LAB;  Service: Cardiovascular;  Laterality: N/A;   EYE SURGERY Bilateral    NASAL SINUS SURGERY  1970   shunt in head  2022   Family History  Adopted: Yes   Social History   Socioeconomic History   Marital status: Married    Spouse name: Not on file   Number of children: Not on file   Years of education: Not on file   Highest education level: Not on file  Occupational History   Not on file  Tobacco Use   Smoking status: Every Day    Packs/day: 0.25    Types: Cigarettes    Smokeless tobacco: Never  Vaping Use   Vaping Use: Never used  Substance and Sexual Activity   Alcohol use: Yes    Alcohol/week: 1.0 - 2.0 standard drink of alcohol    Types: 1 - 2 Glasses of wine per week   Drug use: Never   Sexual activity: Not Currently  Other Topics Concern   Not on file  Social History Narrative   Not on file   Social Determinants of Health   Financial Resource Strain: Low Risk  (06/26/2022)   Overall Financial Resource Strain (CARDIA)    Difficulty of Paying Living Expenses: Not hard at all  Food Insecurity: No Food Insecurity (06/26/2022)   Hunger Vital Sign    Worried About Running Out of Food in the Last Year: Never true    Ran Out of Food in the Last Year: Never true  Transportation Needs: No Transportation Needs (06/26/2022)   PRAPARE - Administrator, Civil Service (Medical): No    Lack of Transportation (Non-Medical): No  Physical Activity: Inactive (06/26/2022)   Exercise Vital Sign    Days of Exercise per Week: 0 days    Minutes of Exercise per Session: 0 min  Stress: No Stress Concern Present (06/26/2022)   [employer] of Occupational Health - Occupational Stress Questionnaire    Feeling of Stress : Not at all  Social Connections: Moderately Isolated (06/26/2022)   Social Connection and Isolation Panel [NHANES]    Frequency of Communication with Friends and Family: More than three times a week    Frequency of Social Gatherings with Friends and Family: More than three times a week    Attends Religious Services: Never    Database administrator or Organizations: No    Attends Engineer, structural: Never    Marital Status: Married    Tobacco Counseling Ready to quit: Not Answered Counseling given: Not Answered   Clinical Intake:  Pre-visit preparation completed: Yes  Pain : No/denies pain     Nutritional Risks: None Diabetes: No  How often do you need to have someone help you when you read instructions,  pamphlets, or other written materials from your doctor or pharmacy?: 2 - Rarely What is the last grade level you completed in school?: 11  Diabetic?No  Interpreter Needed?: No  Information entered by :: Freada Bergeron, CMA   Activities of Daily Living    06/26/2022   10:56 AM 03/18/2022    1:06 PM  In your present state of health, do you have any difficulty performing the following activities:  Hearing? 0 0  Vision? 0 0  Difficulty concentrating or making decisions? 0 1  Walking or climbing stairs? 1 1  Dressing or bathing? 0 1  Doing errands, shopping? 1 1  Preparing Food and eating ? N  Using the Toilet? N   In the past six months, have you accidently leaked urine? Y   Do you have problems with loss of bowel control? N   Managing your Medications? N   Managing your Finances? N   Housekeeping or managing your Housekeeping? N     Patient Care Team: Teodora Medici, DO as PCP - General (Internal Medicine)  Indicate any recent Medical Services you may have received from other than [facility] providers in the past year (date may be approximate).     Assessment:   This is a routine wellness examination for [place].  Hearing/Vision screen No results found.  Dietary issues and exercise activities discussed: Current Exercise Habits: Home exercise routine, Type of exercise: walking, Time (Minutes): 20, Frequency (Times/Week): 7, Weekly Exercise (Minutes/Week): 140, Intensity: Moderate, Exercise limited by: orthopedic condition(s)   Goals Addressed   None   Depression Screen    06/26/2022   10:56 AM 06/26/2022   10:53 AM 03/18/2022    1:06 PM 02/17/2022    3:03 PM 12/20/2021   10:23 AM  PHQ 2/9 Scores  PHQ - 2 Score 0 0 0 5 1  PHQ- 9 Score   0 10 5    Fall Risk    06/26/2022   10:55 AM 03/18/2022    1:05 PM 02/17/2022    2:59 PM 12/20/2021   10:22 AM  Fall Risk   Falls in the past year? 1 1 1 1   Number falls in past yr: 1 1 1 1   Injury with Fall? 1 1 1 1   Risk for  fall due to : History of fall(s);Impaired balance/gait;Impaired mobility Impaired balance/gait;Impaired mobility Impaired balance/gait;Impaired mobility Impaired balance/gait;Impaired mobility  Follow up Falls evaluation completed;Education provided;Falls prevention discussed       FALL RISK PREVENTION PERTAINING TO THE HOME:  Any stairs in or around the home? No  If so, are there any without handrails?  N/A Home free of loose throw rugs in walkways, pet beds, electrical cords, etc? Yes  Adequate lighting in your home to reduce risk of falls? Yes   ASSISTIVE DEVICES UTILIZED TO PREVENT FALLS:  Life alert? No  Use of a cane, walker or w/c? Yes  Grab bars in the bathroom? Yes  Shower chair or bench in shower? Yes  Elevated toilet seat or a handicapped toilet? Yes   TIMED UP AND GO:  Was the test performed? No .  Length of time to ambulate 10 feet: N/A sec.    Cognitive Function:    02/17/2022    3:21 PM  MMSE - Mini Mental State Exam  Orientation to time 5  Orientation to Place 5  Registration 3  Attention/ Calculation 5  Recall 3  Language- name 2 objects 2  Language- repeat 1  Language- follow 3 step command 3  Language- read & follow direction 1  Write a sentence 1  Copy design 0  Total score 29        06/26/2022   10:57 AM  6CIT Screen  What Year? 0 points  What month? 0 points  What time? 0 points  Count back from 20 0 points  Months in reverse 0 points  Repeat phrase 0 points  Total Score 0 points    Immunizations Immunization History  Administered Date(s) Administered   Fluad Quad(high Dose 65+) 06/06/2021   Influenza Split 07/17/2011   Influenza, High Dose Seasonal PF 07/07/2012, 06/11/2017, 07/13/2018, 06/21/2019, 07/09/2020, 07/01/2021   Influenza-Unspecified 09/19/2008   Moderna Sars-Covid-2  Vaccination 10/20/2019, 11/17/2019, 08/11/2020, 02/15/2021   Pneumococcal Polysaccharide-23 07/18/2013, 03/19/2017   Zoster, Live 11/05/2009     Flu  Vaccine status: Due, Education has been provided regarding the importance of this vaccine. Advised may receive this vaccine at local pharmacy or Health Dept. Aware to provide a copy of the vaccination record if obtained from local pharmacy or Health Dept. Verbalized acceptance and understanding.  Pneumococcal vaccine status: Up to date  Covid-19 vaccine status: Completed vaccines  Qualifies for Shingles Vaccine? Yes   Zostavax completed No   Shingrix Completed?: No.    Education has been provided regarding the importance of this vaccine. Patient has been advised to call insurance company to determine out of pocket expense if they have not yet received this vaccine. Advised may also receive vaccine at local pharmacy or Health Dept. Verbalized acceptance and understanding.  Screening Tests Health Maintenance  Topic Date Due   Hepatitis C Screening  Never done   Zoster Vaccines- Shingrix (1 of 2) Never done   DEXA SCAN  Never done   COVID-19 Vaccine (5 - Moderna series) 04/12/2021   INFLUENZA VACCINE  05/06/2022   Pneumonia Vaccine 66+ Years old (2 - PCV) 12/21/2022 (Originally 03/19/2018)   HPV VACCINES  Aged Out   TETANUS/TDAP  Discontinued    Health Maintenance  Health Maintenance Due  Topic Date Due   Hepatitis C Screening  Never done   Zoster Vaccines- Shingrix (1 of 2) Never done   DEXA SCAN  Never done   COVID-19 Vaccine (5 - Moderna series) 04/12/2021   INFLUENZA VACCINE  05/06/2022    Colorectal cancer screening: No longer required.   Mammogram status: No longer required due to age.  Bone Density status: Ordered 06/26/2022. Pt provided with contact info and advised to call to schedule appt.  Lung Cancer Screening: (Low Dose CT Chest recommended if Age 24-80 years, 30 pack-year currently smoking OR have quit w/in 15years.) does not qualify.   Lung Cancer Screening Referral: N/A  Additional Screening:  Hepatitis C Screening: does qualify; Completed N/A  Vision  Screening: Recommended annual ophthalmology exams for early detection of glaucoma and other disorders of the eye. Is the patient up to date with their annual eye exam?  Yes  Who is the provider or what is the name of the office in which the patient attends annual eye exams? [facility] Nov 2023 If pt is not established with a provider, would they like to be referred to a provider to establish care? No .   Dental Screening: Recommended annual dental exams for proper oral hygiene  Community Resource Referral / Chronic Care Management: CRR required this visit?  No   CCM required this visit?  No      Plan:     I have personally reviewed and noted the following in the patient's chart:   Medical and social history Use of alcohol, tobacco or illicit drugs  Current medications and supplements including opioid prescriptions.  Functional ability and statusPatient is not currently taking opioid prescriptions. Nutritional status Physical activity Advanced directives List of other physicians Hospitalizations, surgeries, and ER visits in previous 12 months Vitals Screenings to include cognitive, depression, and falls Referrals and appointments  In addition, I have reviewed and discussed with patient certain preventive protocols, quality metrics, and best practice recommendations. A written personalized care plan for preventive services as well as general preventive health recommendations were provided to patient.     Benay Pike, CMA   06/26/2022  Nurse Notes:   Ms. Tracy Parsons , Thank you for taking time to come for your Medicare Wellness Visit. I appreciate your ongoing commitment to your health goals. Please review the following plan we discussed and let me know if I can assist you in the future.   These are the goals we discussed:  Goals   None     This is a list of the screening recommended for you and due dates:  Health Maintenance  Topic Date Due   Hepatitis C  Screening: USPSTF Recommendation to screen - Ages 4618-79 yo.  Never done   Zoster (Shingles) Vaccine (1 of 2) Never done   DEXA scan (bone density measurement)  Never done   COVID-19 Vaccine (5 - Moderna series) 04/12/2021   Flu Shot  05/06/2022   Pneumonia Vaccine (2 - PCV) 12/21/2022*   HPV Vaccine  Aged Out   Tetanus Vaccine  Discontinued  *Topic was postponed. The date shown is not the original due date.

## 2022-08-19 ENCOUNTER — Other Ambulatory Visit: Payer: Self-pay | Admitting: Internal Medicine

## 2022-08-19 DIAGNOSIS — E039 Hypothyroidism, unspecified: Secondary | ICD-10-CM

## 2022-08-19 DIAGNOSIS — I10 Essential (primary) hypertension: Secondary | ICD-10-CM

## 2022-08-19 NOTE — Telephone Encounter (Signed)
Requested Prescriptions  Pending Prescriptions Disp Refills   lisinopril (ZESTRIL) 20 MG tablet [Pharmacy Med Name: LISINOPRIL 20 MG TAB] 90 tablet 0    Sig: TAKE 1 TABLET BY MOUTH DAILY     Cardiovascular:  ACE Inhibitors Failed - 08/19/2022 10:22 AM      Failed - Last BP in normal range    BP Readings from Last 1 Encounters:  04/01/22 (!) 84/53         Passed - Cr in normal range and within 180 days    Creatinine, Ser  Date Value Ref Range Status  03/07/2022 0.85 0.44 - 1.00 mg/dL Final         Passed - K in normal range and within 180 days    Potassium  Date Value Ref Range Status  03/07/2022 3.8 3.5 - 5.1 mmol/L Final         Passed - Patient is not pregnant      Passed - Valid encounter within last 6 months    Recent Outpatient Visits           5 months ago Essential hypertension   [facility] Margarita Mail, DO   6 months ago Essential hypertension   [facility] Margarita Mail, DO   8 months ago Essential hypertension   [facility] Margarita Mail, DO       Future Appointments             In 1 month Margarita Mail, DO [facility]   In 1 month Deirdre Evener, MD [facility]   In 10 months  [facility]             levothyroxine (SYNTHROID) 75 MCG tablet [Pharmacy Med Name: LEVOTHYROXINE SODIUM 75 MCG TAB] 90 tablet 0    Sig: TAKE 1 TABLET BY MOUTH DAILY     Endocrinology:  Hypothyroid Agents Passed - 08/19/2022 10:22 AM      Passed - TSH in normal range and within 360 days    TSH  Date Value Ref Range Status  10/31/2021 2.83 0.41 - 5.90 Final         Passed - Valid encounter within last 12 months    Recent Outpatient Visits           5 months ago Essential hypertension   [facility] Margarita Mail, DO   6 months ago Essential hypertension   [facility] Margarita Mail, DO   8 months ago Essential hypertension   [facility] Margarita Mail, DO       Future Appointments             In 1 month Margarita Mail, DO [facility]   In 1 month Deirdre Evener, [facility]   In 10 months  [facility]

## 2022-09-17 ENCOUNTER — Other Ambulatory Visit: Payer: Self-pay | Admitting: Internal Medicine

## 2022-09-17 DIAGNOSIS — E7800 Pure hypercholesterolemia, unspecified: Secondary | ICD-10-CM

## 2022-09-17 NOTE — Telephone Encounter (Signed)
Requested Prescriptions  Pending Prescriptions Disp Refills   lovastatin (MEVACOR) 40 MG tablet [Pharmacy Med Name: LOVASTATIN 40 MG TAB] 90 tablet 0    Sig: TAKE 1 TABLET BY MOUTH DAILY     Cardiovascular:  Antilipid - Statins 2 Failed - 09/17/2022 10:32 AM      Failed - Lipid Panel in normal range within the last 12 months    Cholesterol  Date Value Ref Range Status  10/31/2021 222 (A) 0 - 200 Final   LDL Cholesterol  Date Value Ref Range Status  10/31/2021 127  Final   HDL  Date Value Ref Range Status  10/31/2021 64 35 - 70 Final   Triglycerides  Date Value Ref Range Status  10/31/2021 180 (A) 40 - 160 Final         Passed - Cr in normal range and within 360 days    Creatinine, Ser  Date Value Ref Range Status  03/07/2022 0.85 0.44 - 1.00 mg/dL Final         Passed - Patient is not pregnant      Passed - Valid encounter within last 12 months    Recent Outpatient Visits           6 months ago Essential hypertension   [facility] Margarita Mail, DO   7 months ago Essential hypertension   [facility] Margarita Mail, DO   9 months ago Essential hypertension   [facility] Margarita Mail, DO       Future Appointments             Tomorrow Margarita Mail, DO [facility], [facility]   In 1 week Deirdre Evener, MD [facility]   In 9 months  [facility]

## 2022-09-18 ENCOUNTER — Encounter: Payer: Self-pay | Admitting: Internal Medicine

## 2022-09-18 ENCOUNTER — Ambulatory Visit (INDEPENDENT_AMBULATORY_CARE_PROVIDER_SITE_OTHER): Payer: Medicare Other | Admitting: Internal Medicine

## 2022-09-18 VITALS — BP 116/68 | HR 101 | Temp 98.2°F | Resp 16 | Ht 60.5 in | Wt 117.8 lb

## 2022-09-18 DIAGNOSIS — E039 Hypothyroidism, unspecified: Secondary | ICD-10-CM | POA: Diagnosis not present

## 2022-09-18 DIAGNOSIS — I739 Peripheral vascular disease, unspecified: Secondary | ICD-10-CM | POA: Diagnosis not present

## 2022-09-18 DIAGNOSIS — F330 Major depressive disorder, recurrent, mild: Secondary | ICD-10-CM

## 2022-09-18 DIAGNOSIS — I10 Essential (primary) hypertension: Secondary | ICD-10-CM

## 2022-09-18 DIAGNOSIS — E7800 Pure hypercholesterolemia, unspecified: Secondary | ICD-10-CM

## 2022-09-18 DIAGNOSIS — Z23 Encounter for immunization: Secondary | ICD-10-CM | POA: Diagnosis not present

## 2022-09-18 MED ORDER — LEVOTHYROXINE SODIUM 75 MCG PO TABS: 75.0000 ug | ORAL_TABLET | Freq: Every day | ORAL | 1 refills | Status: DC

## 2022-09-18 MED ORDER — CILOSTAZOL 50 MG PO TABS: 50.0000 mg | ORAL_TABLET | Freq: Two times a day (BID) | ORAL | 1 refills | Status: DC

## 2022-09-18 MED ORDER — SERTRALINE HCL 50 MG PO TABS: 50.0000 mg | ORAL_TABLET | Freq: Every day | ORAL | 0 refills | Status: DC

## 2022-09-18 MED ORDER — LOVASTATIN 40 MG PO TABS: 40.0000 mg | ORAL_TABLET | Freq: Every day | ORAL | 1 refills | Status: DC

## 2022-09-18 MED ORDER — LISINOPRIL 20 MG PO TABS: 20.0000 mg | ORAL_TABLET | Freq: Every day | ORAL | 1 refills | Status: DC

## 2022-09-18 NOTE — Progress Notes (Signed)
Established Patient Office Visit  Subjective:  Patient ID: Tracy Parsons, female    DOB: 08/22/1946  Age: 76 y.o. MRN: [id number]  CC:  Chief Complaint  Patient presents with   Follow-up    HPI Shaherah Ferris presents for follow up on chronic medical conditions.   Hypertension: -Medications: Lisinopril 20 mg -Patient is compliant with above medications and reports no side effects. -Checking BP at home (average): not checking currently  -Denies any SOB, CP, vision changes, LE edema or symptoms of hypotension  AAA/PAD: -CTA 12/22 showing AAA measuring 5.2 x 3.6 cm  -Underwent open endovascular repair with multiple stents placed 12/04/21 at [facility], recovering well.  -Followed up with vascular surgery 04/01/22, appointment scheduled 10/14/22 for repeat ABIs -Currently on Cilostazol, aspirin -Working with physical therapy short walks, exercise bike and doing well   HLD/CAD/PAD: -Medications: Lovastatin 40 mg -Patient is compliant with above medications and reports no side effects.  -Last lipid panel: 9/22 Triglycerides 109 - blood work done at last PCP in 1/23. TC 222, triglycerides 180, LDL 127, HDL 64.  Hypothyroidism: -Medications: Levothyroxine 75 mcg, been on this dose for years -Patient is compliant with the above medication (s) at the above dose and reports no medication side effects.  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 Small chronic subdural hematoma overlying left cerebral hemisphere measuring 0.6 cm which is decreased in size.  -Was seen in the ER  03/07/22 for headache. Had 2 head CT's, first showed moderate hydrocephalus despite ventriculostomy catheter in place. Trace 2 mm thick left cerebral convexity subdural thickening vs. Subdural hematoma, possibly chronic with patchy periventricular white matter hypo-densities nonspecific possible representing periventricular edema vs chronic microvascular disease. Second a few hours later showed unchanged thin left cerebral convexity subdural thickening vs minimal subdural hematoma without associated mass effect. Stable hydrocephalus with ventriculostomy catheter in place.  -Seen by Neurosurgery on 02/27/22  MDD: -Mood status: stable -Current treatment: Zoloft 200 mg but wants to try to taper off of it, feels very apathetic on it  -Satisfied with current treatment?: no -Symptom severity: mild  -Duration of current treatment : years Medication compliance: excellent compliance     06/26/2022   10:56 AM 06/26/2022   10:53 AM 03/18/2022    1:06 PM 02/17/2022    3:03 PM 12/20/2021   10:23 AM  Depression screen PHQ 2/9  Decreased Interest 0 0 0 3 1  Down, Depressed, Hopeless 0 0 0 2 0  PHQ - 2 Score 0 0 0 5 1  Altered sleeping   0 1 0  Tired, decreased energy   0 1 2  Change in appetite   0 1 2  Feeling bad or failure about yourself    0 0 0  Trouble concentrating   0 1 0  Moving slowly or fidgety/restless   0 1 0  Suicidal thoughts   0 0 0  PHQ-9 Score   0 10 5  Difficult doing work/chores   Not difficult at all Somewhat difficult Somewhat difficult   Health Maintenance: -Blood work due -Due to patient request, all screenings such as mammograms, DEXA, colonoscopies will  be discontinued  -Prevnar 20 due  Past Medical History:  Diagnosis Date   Anemia    Aortic atherosclerosis (HCC)    Atherosclerosis of native arteries of extremity with intermittent claudication (HCC)    CAD (coronary artery disease)    Depression    Diastolic dysfunction    a.) TTE 11/18/2021: EF 60-65%; triv MR; G1DD    Heart murmur    History of kidney stones    HLD (hyperlipidemia)    Hypertension    Hypothyroidism    Infrarenal abdominal aortic aneurysm (AAA) without rupture (HCC)    a.) CTA 09/16/2021: infrarenal AAA with mural thrombus measuring 5.2 x 3.6 cm   Long term current use of antithrombotics/antiplatelets    a.) ASA + cilostazol   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[facility])     Past Surgical History:  Procedure Laterality Date   ABDOMINAL HYSTERECTOMY     ENDOVASCULAR REPAIR/STENT GRAFT N/A 12/04/2021   Procedure: ENDOVASCULAR REPAIR/STENT GRAFT;  Surgeon: Annice Needy, MD;  Location: [facility] INVASIVE CV LAB;  Service: Cardiovascular;  Laterality: N/A;   EYE SURGERY Bilateral    NASAL SINUS SURGERY  1970   shunt in head  2022    Family History  Adopted: Yes    Social History   Socioeconomic History   Marital status: Married    Spouse name: Not on file   Number of children: Not on file   Years of education: Not on file   Highest education level: Not on file  Occupational History   Not on file  Tobacco Use   Smoking status: Every Day    Packs/day: 0.25    Types: Cigarettes   Smokeless tobacco: Never  Vaping Use   Vaping Use: Never used  Substance and Sexual Activity   Alcohol use: Yes    Alcohol/week: 1.0 - 2.0 standard drink of alcohol    Types: 1 - 2 Glasses of wine per week   Drug use: Never   Sexual activity: Not Currently  Other Topics Concern   Not on file  Social History Narrative   Not on file   Social Determinants of Health   Financial Resource Strain: Low Risk   (06/26/2022)   Overall Financial Resource Strain (CARDIA)    Difficulty of Paying Living Expenses: Not hard at all  Food Insecurity: No Food Insecurity (06/26/2022)   Hunger Vital Sign    Worried About Running Out of Food in the Last Year: Never true    Ran Out of Food in the Last Year: Never true  Transportation Needs: No Transportation Needs (06/26/2022)   PRAPARE - Administrator, Civil Service (Medical): No    Lack of Transportation (Non-Medical): No  Physical Activity: Inactive (06/26/2022)   Exercise Vital Sign    Days of Exercise per Week: 0 days    Minutes of Exercise per Session: 0 min  Stress: No Stress Concern Present (06/26/2022)   [employer] of Occupational Health - Occupational Stress Questionnaire    Feeling of Stress : Not at all  Social Connections: Moderately Isolated (06/26/2022)  Social Advertising account executive [NHANES]    Frequency of Communication with Friends and Family: More than three times a week    Frequency of Social Gatherings with Friends and Family: More than three times a week    Attends Religious Services: Never    Database administrator or Organizations: No    Attends Banker Meetings: Never    Marital Status: Married  Catering manager Violence: Not At Risk (06/26/2022)   Humiliation, Afraid, Rape, and Kick questionnaire    Fear of Current or Ex-Partner: No    Emotionally Abused: No    Physically Abused: No    Sexually Abused: No    ROS Review of Systems  Constitutional:  Negative for chills and fever.  Eyes:  Negative for visual disturbance.  Respiratory:  Negative for cough.   Cardiovascular:  Negative for chest pain.  Gastrointestinal:  Negative for abdominal pain.  Neurological:  Negative for dizziness.    Objective:   Today's Vitals: BP 116/68   Pulse (!) 101   Temp 98.2 F (36.8 C)   Resp 16   Ht 5' 0.5" (1.537 m)   Wt 117 lb 12.8 oz (53.4 kg)   SpO2 97%   BMI 22.63 kg/m   Physical  Exam Constitutional:      Appearance: Normal appearance.     Comments: Presents with walker  HENT:     Head: Normocephalic and atraumatic.  Eyes:     Conjunctiva/sclera: Conjunctivae normal.  Cardiovascular:     Rate and Rhythm: Normal rate and regular rhythm.  Pulmonary:     Effort: Pulmonary effort is normal.     Breath sounds: Normal breath sounds.  Musculoskeletal:     Right lower leg: No edema.     Left lower leg: No edema.  Skin:    General: Skin is warm and dry.  Neurological:     General: No focal deficit present.     Mental Status: She is alert. Mental status is at baseline.  Psychiatric:        Mood and Affect: Mood normal.        Behavior: Behavior normal.     Assessment & Plan:   1. Essential hypertension: Chronic and stable.  Blood pressure at goal here today.  Continue lisinopril 20 mg, refilled today.  Due for annual labs including CBC and CMP.  Follow-up in 6 months for recheck.  - CBC w/Diff/Platelet - COMPLETE METABOLIC PANEL WITH GFR - lisinopril (ZESTRIL) 20 MG tablet; Take 1 tablet (20 mg total) by mouth daily.  Dispense: 90 tablet; Refill: 1  2. Pure hypercholesterolemia: Due for annual lipid panel, patient is not fasting but she will return either tomorrow or next week for fasting labs.  Continue lovastatin 40 mg, refilled today.  - Lipid Profile - lovastatin (MEVACOR) 40 MG tablet; Take 1 tablet (40 mg total) by mouth daily.  Dispense: 90 tablet; Refill: 1  3. PAD (peripheral artery disease) [facility]): Patient is following with vascular surgery, has an appointment on 08/15/2023 for repeat ABIs.  Continue Cilostazol and aspirin, refilled.   - cilostazol (PLETAL) 50 MG tablet; Take 1 tablet (50 mg total) by mouth 2 (two) times daily.  Dispense: 180 tablet; Refill: 1  4. Hypothyroidism, unspecified type: Symptoms stable, recheck TSH. Continue Levothyroxine 75 mcg, refilled.  - TSH - levothyroxine (SYNTHROID) 75 MCG tablet; Take 1 tablet (75 mcg total) by  mouth daily.  Dispense: 90 tablet; Refill: 1  5. Mild episode of recurrent  major depressive disorder [facility]): Patient wants to taper off Zoloft, is currently on Zoloft 200 mg. Will go down to 100 mg daily, then after 2 weeks 50 mg daily, then 50 mg every other day for 2 weeks until she has tapered off. Zoloft 50 mg sent to pharmacy to make tapering down easier. Will call with any questions or concerns.   - sertraline (ZOLOFT) 50 MG tablet; Take 1 tablet (50 mg total) by mouth daily.  Dispense: 30 tablet; Refill: 0  6. Vaccine for streptococcus pneumoniae and influenza: Prevnar 20 administered today.   - Pneumococcal conjugate vaccine 20-valent (Prevnar 20)   Follow-up: Return in about 6 months (around 03/20/2023).   Teodora Medici, DO

## 2022-09-18 NOTE — Patient Instructions (Addendum)
It was great seeing you today!  Plan discussed at today's visit: -Blood work ordered today, results will be uploaded to MyChart. Lab is open weekdays from 8-11:30 and 1-4 -Zoloft - start 100 mg once a day for at least 2 weeks, then down to 50 mg a day for another 2 weeks, then 50 mg every other day for 2 weeks, then off. Always change dose when you are ready, if you have side effects keep the dose the same and let me know   Follow up in: 6 months   Take care and let us know if you have any questions or concerns prior to your next visit.  Dr. Caralee Ates

## 2022-09-24 ENCOUNTER — Ambulatory Visit: Payer: Medicare Other | Admitting: Dermatology

## 2022-09-26 LAB — CBC WITH DIFFERENTIAL/PLATELET
Absolute Monocytes: 632 cells/uL (ref 200–950)
Basophils Absolute: 41 cells/uL (ref 0–200)
Basophils Relative: 0.7 %
Eosinophils Absolute: 220 cells/uL (ref 15–500)
Eosinophils Relative: 3.8 %
HCT: 37.1 % (ref 35.0–45.0)
Hemoglobin: 12.6 g/dL (ref 11.7–15.5)
Lymphs Abs: 1653 cells/uL (ref 850–3900)
MCH: 31 pg (ref 27.0–33.0)
MCHC: 34 g/dL (ref 32.0–36.0)
MCV: 91.2 fL (ref 80.0–100.0)
MPV: 9.3 fL (ref 7.5–12.5)
Monocytes Relative: 10.9 %
Neutro Abs: 3254 cells/uL (ref 1500–7800)
Neutrophils Relative %: 56.1 %
Platelets: 339 10*3/uL (ref 140–400)
RBC: 4.07 10*6/uL (ref 3.80–5.10)
RDW: 13.6 % (ref 11.0–15.0)
Total Lymphocyte: 28.5 %
WBC: 5.8 10*3/uL (ref 3.8–10.8)

## 2022-09-26 LAB — COMPLETE METABOLIC PANEL WITH GFR
AG Ratio: 1.4 (calc) (ref 1.0–2.5)
ALT: 9 U/L (ref 6–29)
AST: 13 U/L (ref 10–35)
Albumin: 4.1 g/dL (ref 3.6–5.1)
Alkaline phosphatase (APISO): 80 U/L (ref 37–153)
BUN: 18 mg/dL (ref 7–25)
CO2: 25 mmol/L (ref 20–32)
Calcium: 9.7 mg/dL (ref 8.6–10.4)
Chloride: 107 mmol/L (ref 98–110)
Creat: 0.88 mg/dL (ref 0.60–1.00)
Globulin: 3 g/dL (calc) (ref 1.9–3.7)
Glucose, Bld: 95 mg/dL (ref 65–99)
Potassium: 5.2 mmol/L (ref 3.5–5.3)
Sodium: 139 mmol/L (ref 135–146)
Total Bilirubin: 0.4 mg/dL (ref 0.2–1.2)
Total Protein: 7.1 g/dL (ref 6.1–8.1)
eGFR: 67 mL/min/{1.73_m2} (ref >=60)

## 2022-09-26 LAB — LIPID PANEL
Cholesterol: 231 mg/dL — ABNORMAL HIGH (ref <200)
HDL: 63 mg/dL (ref >=50)
LDL Cholesterol (Calc): 145 mg/dL (calc) — ABNORMAL HIGH
Non-HDL Cholesterol (Calc): 168 mg/dL (calc) — ABNORMAL HIGH (ref <130)
Total CHOL/HDL Ratio: 3.7 (calc) (ref <5.0)
Triglycerides: 117 mg/dL (ref <150)

## 2022-09-26 LAB — TSH: TSH: 5.92 mIU/L — ABNORMAL HIGH (ref 0.40–4.50)

## 2022-10-14 ENCOUNTER — Encounter (INDEPENDENT_AMBULATORY_CARE_PROVIDER_SITE_OTHER): Payer: Medicare Other

## 2022-10-14 ENCOUNTER — Ambulatory Visit (INDEPENDENT_AMBULATORY_CARE_PROVIDER_SITE_OTHER): Payer: Medicare Other | Admitting: Vascular Surgery

## 2022-10-14 ENCOUNTER — Other Ambulatory Visit (INDEPENDENT_AMBULATORY_CARE_PROVIDER_SITE_OTHER): Payer: Medicare Other

## 2022-11-11 ENCOUNTER — Ambulatory Visit: Payer: Medicare Other | Admitting: Dermatology

## 2022-11-25 ENCOUNTER — Other Ambulatory Visit (INDEPENDENT_AMBULATORY_CARE_PROVIDER_SITE_OTHER): Payer: Medicare Other

## 2022-11-25 ENCOUNTER — Encounter (INDEPENDENT_AMBULATORY_CARE_PROVIDER_SITE_OTHER): Payer: Medicare Other

## 2022-11-25 ENCOUNTER — Ambulatory Visit (INDEPENDENT_AMBULATORY_CARE_PROVIDER_SITE_OTHER): Payer: Medicare Other | Admitting: Vascular Surgery

## 2022-12-23 ENCOUNTER — Ambulatory Visit (INDEPENDENT_AMBULATORY_CARE_PROVIDER_SITE_OTHER): Payer: Medicare Other

## 2022-12-23 ENCOUNTER — Encounter (INDEPENDENT_AMBULATORY_CARE_PROVIDER_SITE_OTHER): Payer: Self-pay | Admitting: Vascular Surgery

## 2022-12-23 ENCOUNTER — Ambulatory Visit (INDEPENDENT_AMBULATORY_CARE_PROVIDER_SITE_OTHER): Payer: Medicare Other | Admitting: Vascular Surgery

## 2022-12-23 VITALS — BP 97/65 | HR 102 | Resp 18 | Ht 60.0 in | Wt 118.0 lb

## 2022-12-23 DIAGNOSIS — I70211 Atherosclerosis of native arteries of extremities with intermittent claudication, right leg: Secondary | ICD-10-CM | POA: Diagnosis not present

## 2022-12-23 DIAGNOSIS — I10 Essential (primary) hypertension: Secondary | ICD-10-CM

## 2022-12-23 DIAGNOSIS — I714 Abdominal aortic aneurysm, without rupture, unspecified: Secondary | ICD-10-CM | POA: Diagnosis not present

## 2022-12-23 DIAGNOSIS — E7800 Pure hypercholesterolemia, unspecified: Secondary | ICD-10-CM

## 2022-12-23 NOTE — Progress Notes (Signed)
MRN : [id number]  Tracy Parsons is a 77 y.o. ([birth year]) female who presents with chief complaint of  Chief Complaint  Patient presents with   Follow-up    f/u in 3 months with abi EVAR  .  History of Present Illness: Patient returns today in follow up of multiple vascular issues.  A little over a year ago, she went concomitant lower extremity revascularization for aortoiliac disease as well as an abdominal aortic aneurysm repair.  She still has a fair bit of pain on the inside of her right leg.  This seems to originate from her hip and back area.  This is intermittent and not related to activity in general.  No aneurysm related symptoms other than chronic low back pain. Specifically, the patient denies new back or abdominal pain, or signs of peripheral embolization. ABIs today are 1.4 on the right and 1.0 on the left with brisk waveforms. Duplex today shows a patent stent graft without endoleak and a maximal sac diameter measuring only 3.6 cm.  Current Outpatient Medications  Medication Sig Dispense Refill   ascorbic acid (VITAMIN C) 1000 MG tablet Take by mouth.     Aspirin 81 MG CAPS Take by mouth.     Calcium Citrate-Vitamin D3 1000-0.01 MG/30ML LIQD Take 2 tablets by mouth every morning.     cilostazol (PLETAL) 50 MG tablet Take 1 tablet (50 mg total) by mouth 2 (two) times daily. 180 tablet 1   levothyroxine (SYNTHROID) 75 MCG tablet Take 1 tablet (75 mcg total) by mouth daily. 90 tablet 1   lisinopril (ZESTRIL) 20 MG tablet Take 1 tablet (20 mg total) by mouth daily. 90 tablet 1   lovastatin (MEVACOR) 40 MG tablet Take 1 tablet (40 mg total) by mouth daily. 90 tablet 1   Multiple Vitamin (MULTIVITAMIN) tablet Take 1 tablet by mouth every morning.     sertraline (ZOLOFT) 50 MG tablet Take 1 tablet (50 mg total) by mouth daily. 30 tablet 0   No current facility-administered medications for this visit.    Past Medical History:  Diagnosis Date   Anemia    Aortic  atherosclerosis ([place])    Atherosclerosis of native arteries of extremity with intermittent claudication (HCC)    CAD (coronary artery disease)    Depression    Diastolic dysfunction    a.) TTE 11/18/2021: EF 60-65%; triv MR; G1DD   Heart murmur    History of kidney stones    HLD (hyperlipidemia)    Hypertension    Hypothyroidism    Infrarenal abdominal aortic aneurysm (AAA) without rupture ([place])    a.) CTA 09/16/2021: infrarenal AAA with mural thrombus measuring 5.2 x 3.6 cm   Long term current use of antithrombotics/antiplatelets    a.) ASA + cilostazol   Normal pressure hydrocephalus (HCC)    a.) VP shunt in place   PAD (peripheral artery disease) ([place])    a.) CTA 09/16/2021: 5.2 x 3.6 cm infrarenal AAA, chronic CTO of BILATERAL interal iliac arteries; CTO RIGHT common iliac artery; small calibur LEFT external iliac artery measuring 5.1 x 2.3 cm; 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[facility])     Past Surgical History:  Procedure Laterality Date   ABDOMINAL HYSTERECTOMY     ENDOVASCULAR REPAIR/STENT GRAFT N/A 12/04/2021   Procedure: ENDOVASCULAR REPAIR/STENT GRAFT;  Surgeon: Algernon Huxley, MD;  Location: [place] CV LAB;  Service: Cardiovascular;  Laterality:  N/A;   EYE SURGERY Bilateral    NASAL SINUS SURGERY  1970   shunt in head  2022     Social History   Tobacco Use   Smoking status: Every Day    Packs/day: .25    Types: Cigarettes   Smokeless tobacco: Never  Vaping Use   Vaping Use: Never used  Substance Use Topics   Alcohol use: Yes    Alcohol/week: 1.0 - 2.0 standard drink of alcohol    Types: 1 - 2 Glasses of wine per week   Drug use: Never      Family History  Adopted: Yes     Allergies  Allergen Reactions   Lipitor [Atorvastatin] Other (See Comments)    Confusion    Sulfa Antibiotics Other (See Comments)    Unknown reaction Unknown-- reaction as  child     REVIEW OF SYSTEMS (Negative unless checked)   Constitutional: [] Weight loss  [] Fever  [] Chills Cardiac: [] Chest pain   [] Chest pressure   [] Palpitations   [] Shortness of breath when laying flat   [] Shortness of breath at rest   [] Shortness of breath with exertion. Vascular:  [] Pain in legs with walking   [] Pain in legs at rest   [] Pain in legs when laying flat   [] Claudication   [x] Pain in feet when walking  [] Pain in feet at rest  [] Pain in feet when laying flat   [x] History of DVT   [] Phlebitis   [] Swelling in legs   [] Varicose veins   [] Non-healing ulcers Pulmonary:   [] Uses home oxygen   [] Productive cough   [] Hemoptysis   [] Wheeze  [] COPD   [] Asthma Neurologic:  [] Dizziness  [] Blackouts   [x] Seizures   [] History of stroke   [] History of TIA  [] Aphasia   [] Temporary blindness   [] Dysphagia   [] Weakness or numbness in arms   [] Weakness or numbness in legs Musculoskeletal:  [x] Arthritis   [] Joint swelling   [] Joint pain   [] Low back pain Hematologic:  [] Easy bruising  [] Easy bleeding   [] Hypercoagulable state   [x] Anemic   Gastrointestinal:  [] Blood in stool   [] Vomiting blood  [] Gastroesophageal reflux/heartburn   [] Abdominal pain Genitourinary:  [] Chronic kidney disease   [] Difficult urination  [] Frequent urination  [] Burning with urination   [] Hematuria Skin:  [] Rashes   [] Ulcers   [] Wounds Psychological:  [] History of anxiety   []  History of major depression.  Physical Examination  BP 97/65 (BP Location: Right Arm)   Pulse (!) 102   Resp 18   Ht 5' (1.524 m)   Wt 118 lb (53.5 kg)   BMI 23.05 kg/m  Gen:  WD/WN, NAD Head: [place]/AT, No temporalis wasting. Ear/Nose/Throat: Hearing grossly intact, nares w/o erythema or drainage Eyes: Conjunctiva clear. Sclera non-icteric Neck: Supple.  Trachea midline Pulmonary:  Good air movement, no use of accessory muscles.  Cardiac: RRR, no JVD Vascular:  Vessel Right Left  Radial Palpable Palpable                          PT  Palpable Palpable  DP Palpable Palpable   Gastrointestinal: soft, non-tender/non-distended. No guarding/reflex.  Musculoskeletal: M/S 5/5 throughout.  No deformity or atrophy.  Uses walker.  Trace lower extremity edema. Neurologic: Sensation grossly intact in extremities.  Symmetrical.  Speech is fluent.  Psychiatric: Judgment intact, Mood & affect appropriate for pt's clinical situation. Dermatologic: No rashes or ulcers noted.  No cellulitis or open wounds.  Labs Recent Results (from the past 2160 hour(s))  CBC w/Diff/Platelet     Status: None   Collection Time: 09/25/22 10:03 AM  Result Value Ref Range   WBC 5.8 3.8 - 10.8 Thousand/uL   RBC 4.07 3.80 - 5.10 Million/uL   Hemoglobin 12.6 11.7 - 15.5 g/dL   HCT 37.1 35.0 - 45.0 %   MCV 91.2 80.0 - 100.0 fL   MCH 31.0 27.0 - 33.0 pg   MCHC 34.0 32.0 - 36.0 g/dL   RDW 13.6 11.0 - 15.0 %   Platelets 339 140 - 400 Thousand/uL   MPV 9.3 7.5 - 12.5 fL   Neutro Abs 3,254 1,500 - 7,800 cells/uL   Lymphs Abs 1,653 850 - 3,900 cells/uL   Absolute Monocytes 632 200 - 950 cells/uL   Eosinophils Absolute 220 15 - 500 cells/uL   Basophils Absolute 41 0 - 200 cells/uL   Neutrophils Relative % 56.1 %   Total Lymphocyte 28.5 %   Monocytes Relative 10.9 %   Eosinophils Relative 3.8 %   Basophils Relative 0.7 %  COMPLETE METABOLIC PANEL WITH GFR     Status: None   Collection Time: 09/25/22 10:03 AM  Result Value Ref Range   Glucose, Bld 95 65 - 99 mg/dL    Comment: .            Fasting reference interval .    BUN 18 7 - 25 mg/dL   Creat 0.88 0.60 - 1.00 mg/dL   eGFR 67 > OR = 60 mL/min/1.52m2   BUN/Creatinine Ratio SEE NOTE: 6 - 22 (calc)    Comment:    Not Reported: BUN and Creatinine are within    reference range. .    Sodium 139 135 - 146 mmol/L   Potassium 5.2 3.5 - 5.3 mmol/L   Chloride 107 98 - 110 mmol/L   CO2 25 20 - 32 mmol/L   Calcium 9.7 8.6 - 10.4 mg/dL   Total Protein 7.1 6.1 - 8.1 g/dL   Albumin 4.1 3.6 -  5.1 g/dL   Globulin 3.0 1.9 - 3.7 g/dL (calc)   AG Ratio 1.4 1.0 - 2.5 (calc)   Total Bilirubin 0.4 0.2 - 1.2 mg/dL   Alkaline phosphatase (APISO) 80 37 - 153 U/L   AST 13 10 - 35 U/L   ALT 9 6 - 29 U/L  Lipid Profile     Status: Abnormal   Collection Time: 09/25/22 10:03 AM  Result Value Ref Range   Cholesterol 231 (H) <200 mg/dL   HDL 63 > OR = 50 mg/dL   Triglycerides 117 <150 mg/dL   LDL Cholesterol (Calc) 145 (H) mg/dL (calc)    Comment: Reference range: <100 . Desirable range <100 mg/dL for primary prevention;   <70 mg/dL for patients with CHD or diabetic patients  with > or = 2 CHD risk factors. Marland Kitchen LDL-C is now calculated using the Martin-Hopkins  calculation, which is a validated novel method providing  better accuracy than the Friedewald equation in the  estimation of LDL-C.  Cresenciano Genre et al. Annamaria Helling. [id number]): 2061-2068  (http://education.[facility].com/faq/FAQ164)    Total CHOL/HDL Ratio 3.7 <5.0 (calc)   Non-HDL Cholesterol (Calc) 168 (H) <130 mg/dL (calc)    Comment: For patients with diabetes plus 1 major ASCVD risk  factor, treating to a non-HDL-C goal of <100 mg/dL  (LDL-C of <70 mg/dL) is considered a therapeutic  option.   TSH     Status: Abnormal   Collection Time: 09/25/22  10:03 AM  Result Value Ref Range   TSH 5.92 (H) 0.40 - 4.50 mIU/L    Radiology No results found.  Assessment/Plan Essential hypertension blood pressure control important in reducing the progression of atherosclerotic disease. On appropriate oral medications.     Pure hypercholesterolemia lipid control important in reducing the progression of atherosclerotic disease. Continue statin therapy  Atherosclerosis of native arteries of extremity with intermittent claudication (HCC) ABIs today are 1.4 on the right and 1.0 on the left with brisk waveforms.  Her pain at this point is largely neuropathic in nature.  Recheck ABIs in 1 year.  No change in medical regimen.  AAA  (abdominal aortic aneurysm) without rupture Duplex today shows a patent stent graft without endoleak and a maximal sac diameter measuring only 3.6 cm.  Doing well status post endovascular abdominal aortic aneurysm repair 1 year ago.  Followed annually at this point.    Leotis Pain, MD  12/23/2022 3:30 PM    This note was created with Dragon medical transcription system.  Any errors from dictation are purely unintentional

## 2022-12-23 NOTE — Assessment & Plan Note (Signed)
Duplex today shows a patent stent graft without endoleak and a maximal sac diameter measuring only 3.6 cm.  Doing well status post endovascular abdominal aortic aneurysm repair 1 year ago.  Followed annually at this point.

## 2022-12-23 NOTE — Assessment & Plan Note (Signed)
ABIs today are 1.4 on the right and 1.0 on the left with brisk waveforms.  Her pain at this point is largely neuropathic in nature.  Recheck ABIs in 1 year.  No change in medical regimen.

## 2022-12-26 LAB — VAS US ABI WITH/WO TBI
Left ABI: 1.04
Right ABI: 1.43

## 2023-02-20 IMAGING — CR DG CERVICAL SPINE 1V
1 series · 1 of 1 positions shown · non-contrast
Comparison: None Available.

CLINICAL DATA: Evaluate for shunt malfunction

EXAM:
DG CERVICAL SPINE - 1 VIEW

[c-spine ap]
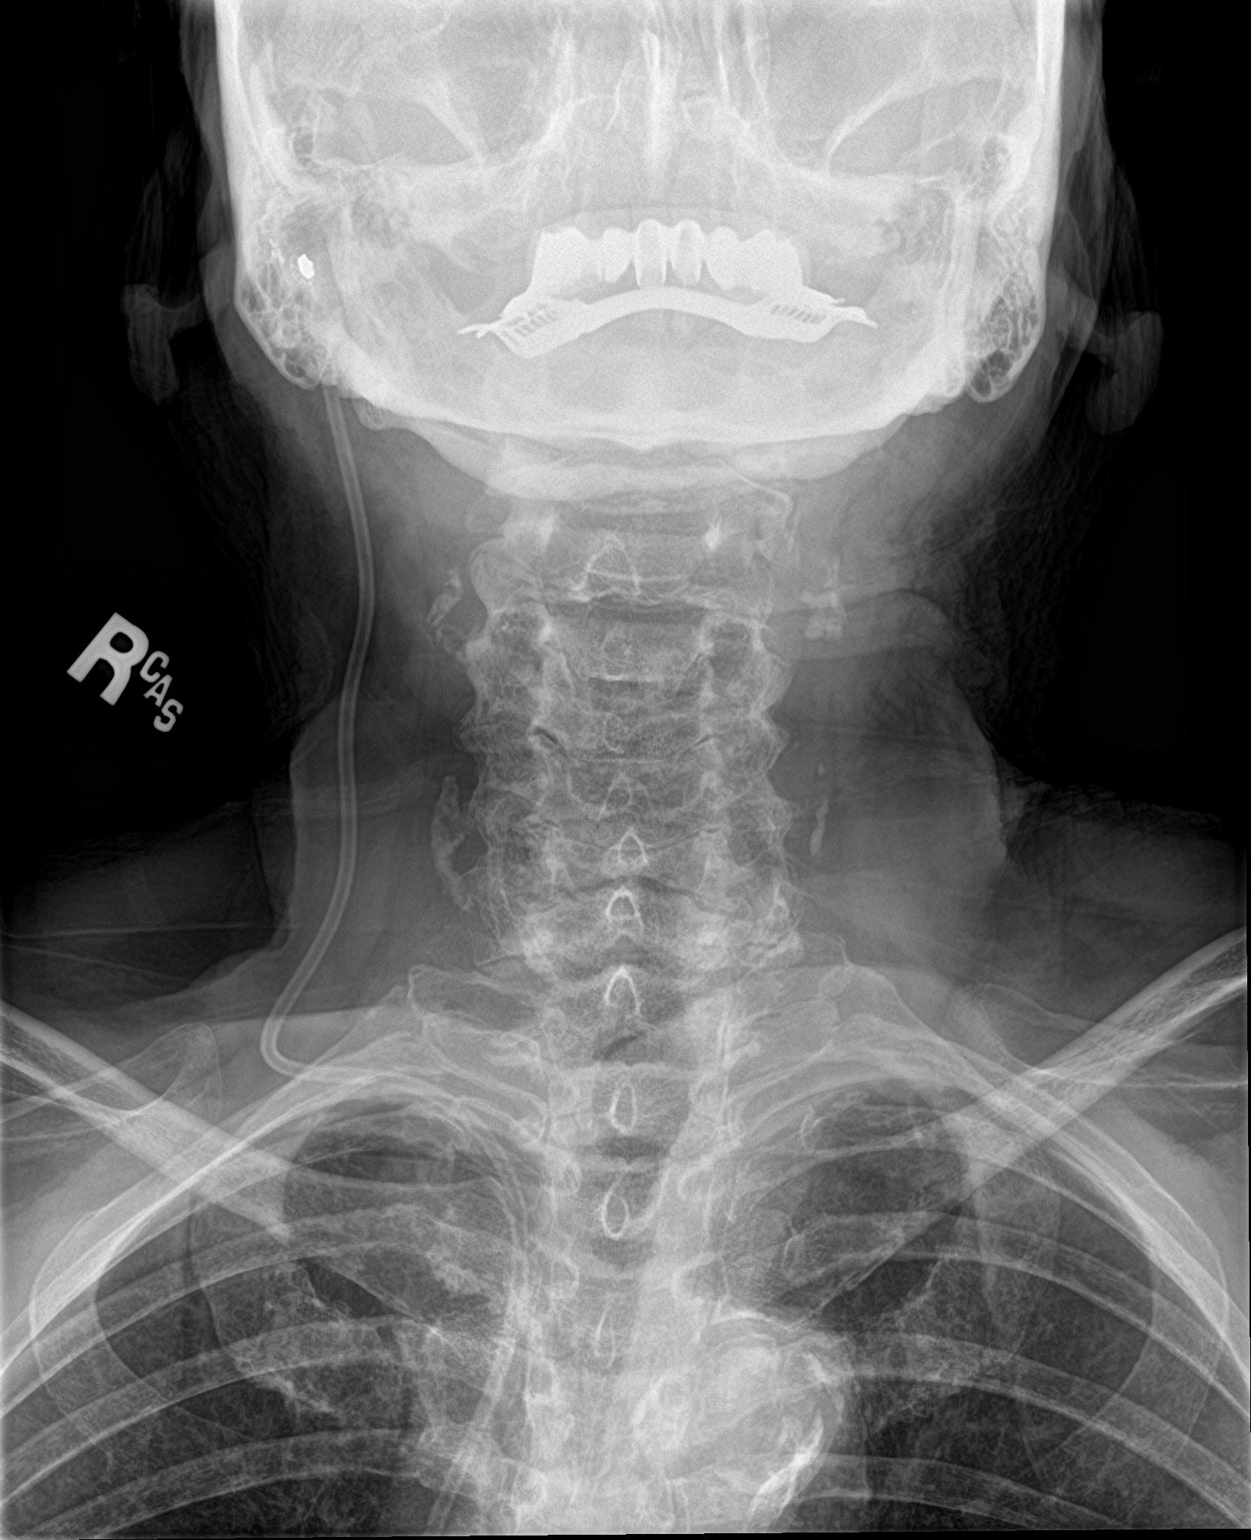

[1 of 1 positions shown; findings below may reference images not displayed]

FINDINGS: A single view of the cervical spine was obtained. Ventricular shunt
tubing is seen in the soft tissues of the right neck. No kink or
break in the tube is identified.

There is calcified atherosclerotic plaque in the bilateral carotid
systems. The cervical spine is suboptimally evaluated given single
AP projection.
IMPRESSION: Ventricular shunt tubing in the right neck appears intact without a
kink or break.

## 2023-02-20 IMAGING — CT CT HEAD W/O CM
4 series · 16 of 47 positions shown, 18 images · non-contrast
Comparison: None Available.

CLINICAL DATA: headache and vomiting, hx/o subdural bleed and brain
shunt



[Series 2: head bone · axial · 0.42mm/px · z∈[+244,+276]mm · 3 of 81 slices shown]
[im 9/81  bone]
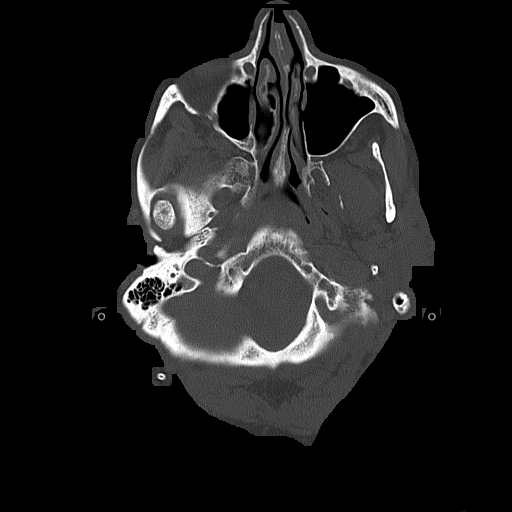
[im 17/81  bone]
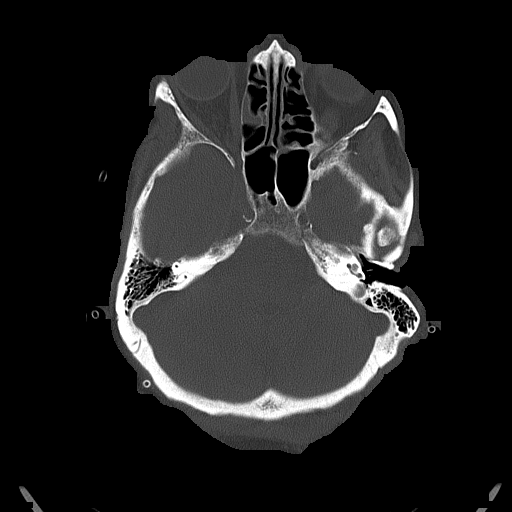
[im 25/81  bone]
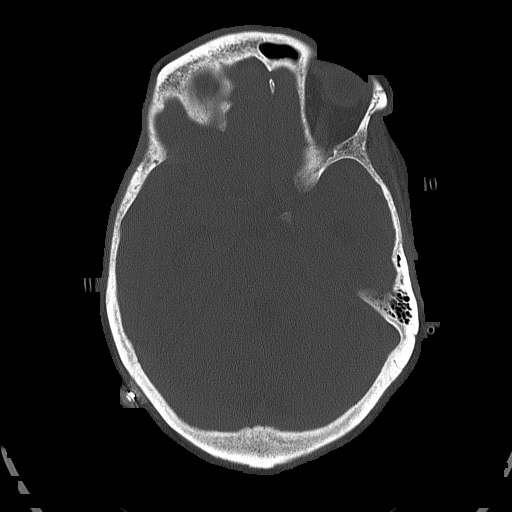

[Series 3: head wo · axial · 0.42mm/px · z∈[+248,+368]mm · 7 of 33 slices shown, 9 images]
[im 5/33  brain]
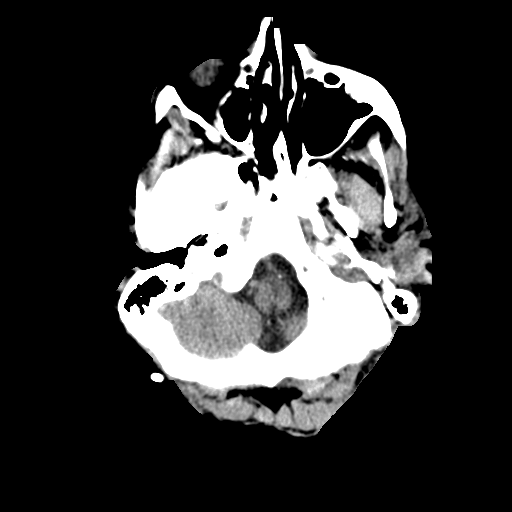
[im 5/33  bone]
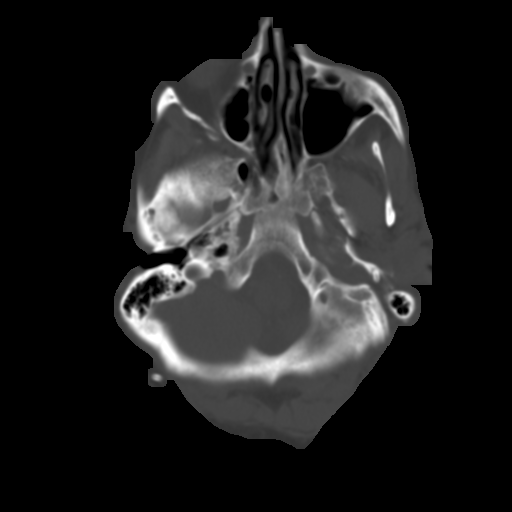
[im 9/33  brain]
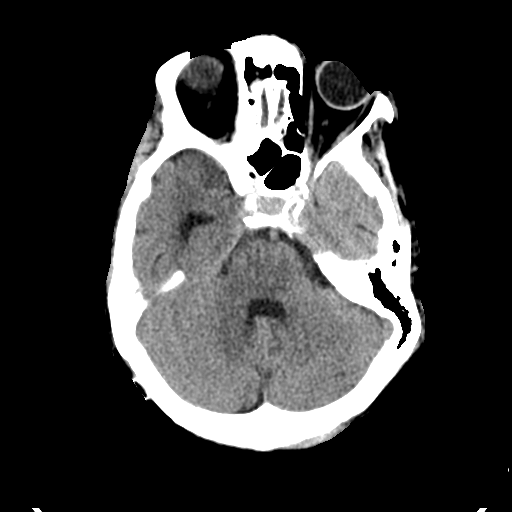
[im 13/33  brain]
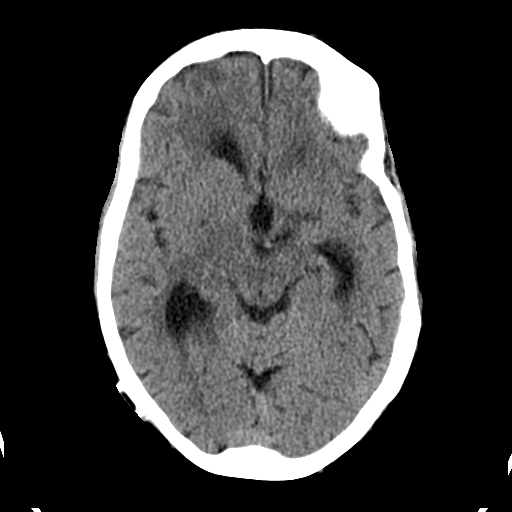
[im 17/33  brain]
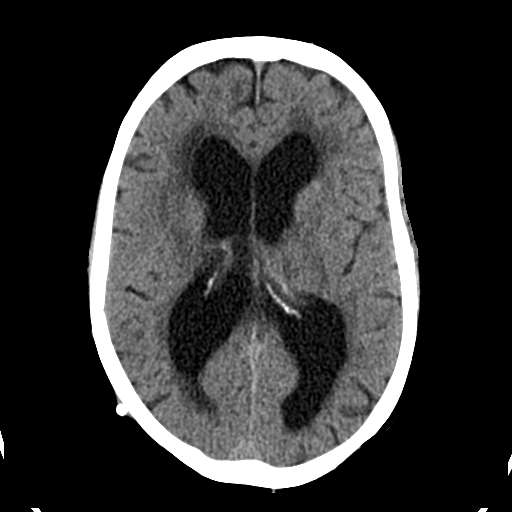
[im 21/33  brain]
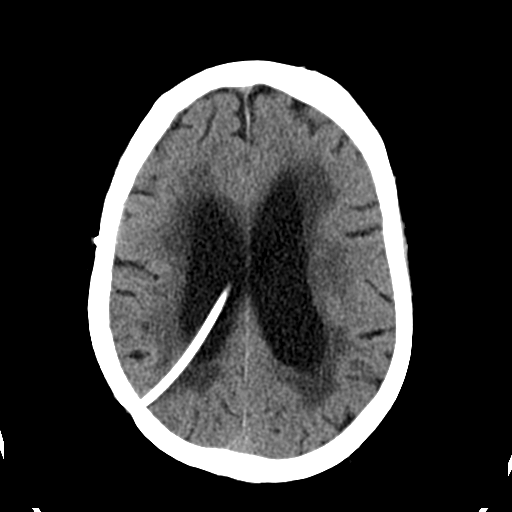
[im 21/33  bone]
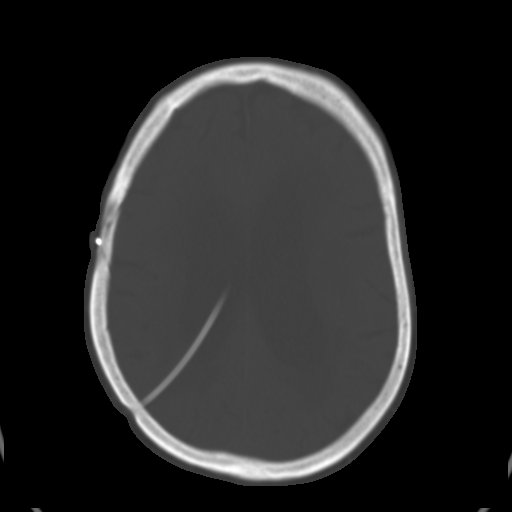
[im 25/33  brain]
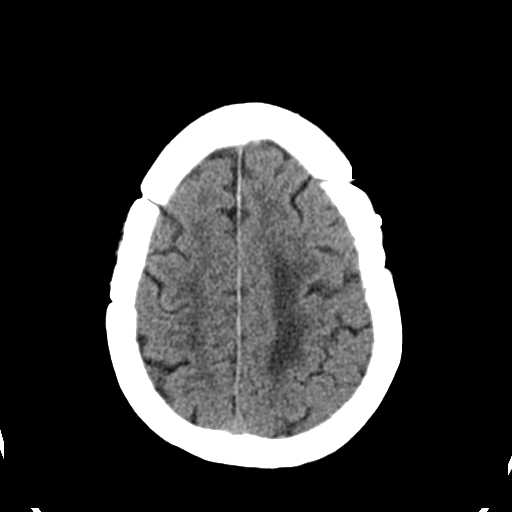
[im 29/33  brain]
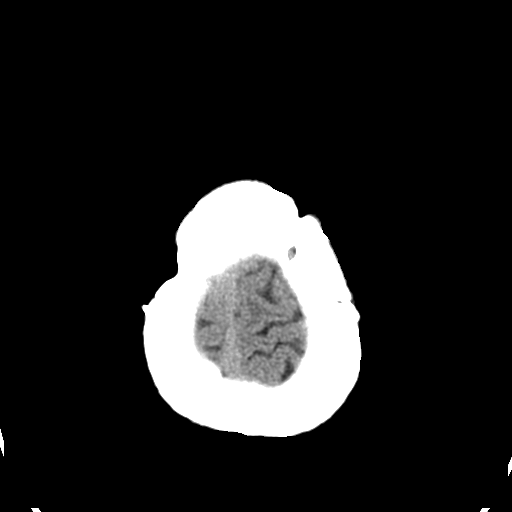

[Series 4: coronal soft tissue · coronal · 0.30mm/px · 3 of 67 slices shown]
[im 23/67  brain]
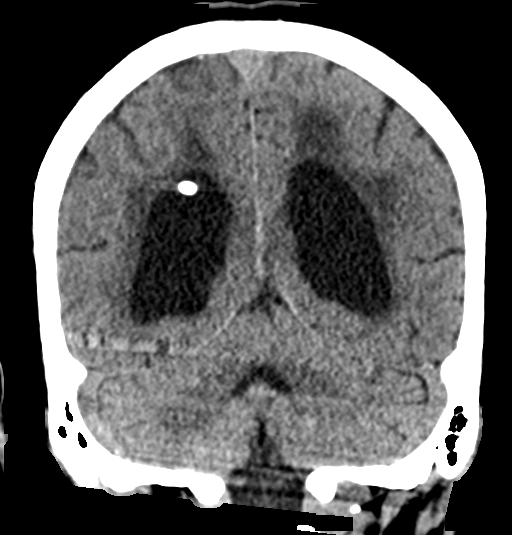
[im 30/67  brain]
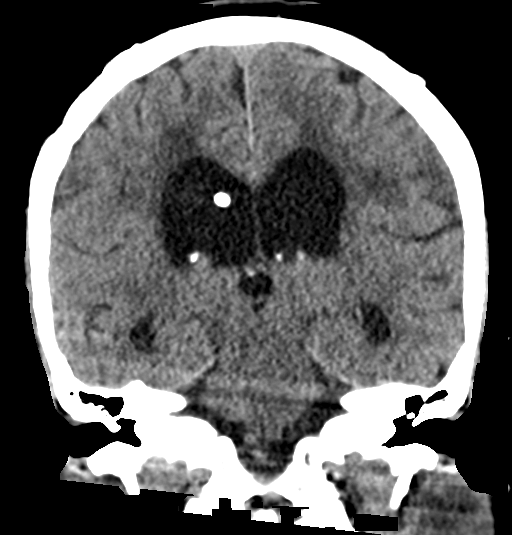
[im 37/67  brain]
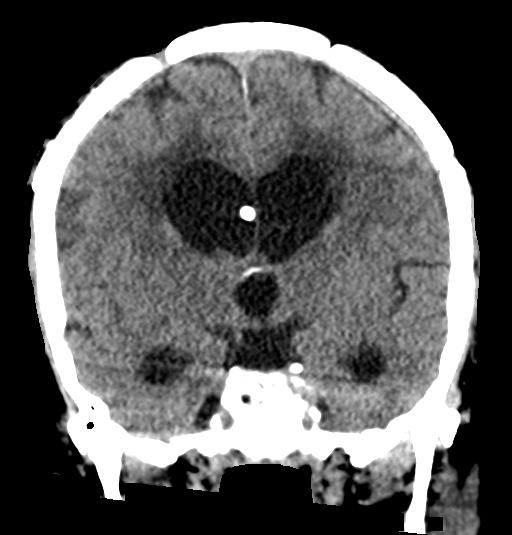

[Series 5: sagittal soft tissue · sagittal · 0.31mm/px · 3 of 51 slices shown]
[im 17/51  brain]
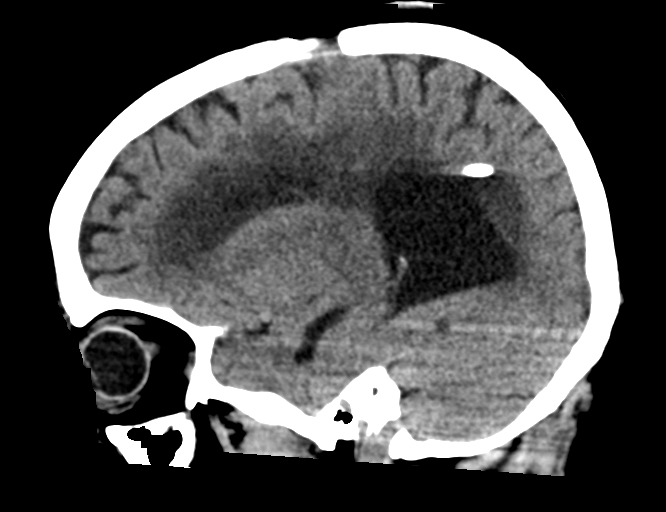
[im 26/51  brain]
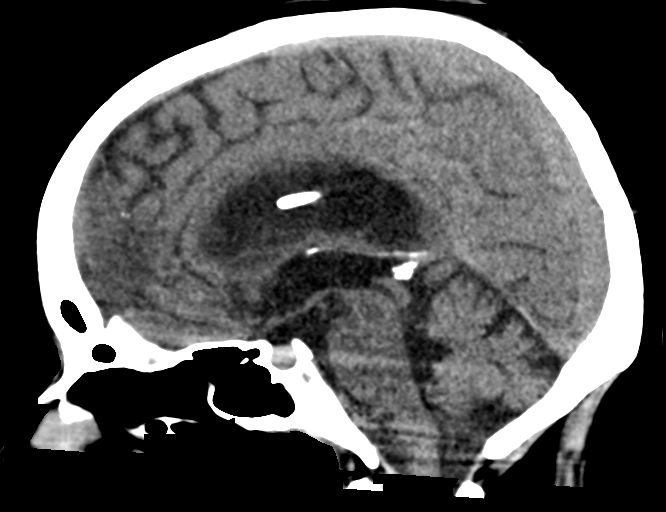
[im 34/51  brain]
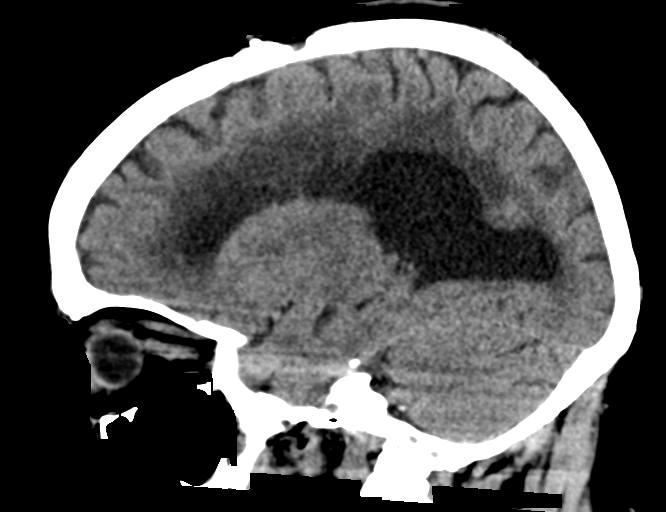

[16 of 47 positions shown; findings below may reference images not displayed]

FINDINGS: Brain: Thin (2 mm thick) left cerebral convexity subdural thickening
versus subdural hematoma. Right parietal approach ventriculostomy
catheter in place with tip adjacent to the septum pellucidum on the
right. Visualized ventriculostomy catheter is intact. Moderate
hydrocephalus with rounding of the temporal horns. Patchy white
matter hypodensities, possibly periventricular edema versus chronic
microvascular ischemic disease. No evidence of acute large vascular
territory infarct, midline shift, or mass lesion.

Vascular: Calcific intracranial atherosclerosis. No hyperdense
vessel identified.

Skull: Bilateral craniotomies.  No acute fracture.

Sinuses/Orbits: Patchy ethmoid air cell opacification. No acute
orbital findings.

Other: No mastoid effusions.
IMPRESSION: 1. Moderate hydrocephalus despite ventriculostomy catheter in place.
No priors available to assess for change.
2. Trace/thin (2 mm thick) left cerebral convexity subdural
thickening versus subdural hematoma, possibly chronic given the
reported history but age-indeterminate without priors for
comparison.
3. Patchy periventricular white matter hypodensities, nonspecific
but potentially representing periventricular edema (given above
hydrocephalus) versus chronic microvascular disease.

## 2023-02-20 IMAGING — CR DG CHEST 1V
1 series · 1 of 1 positions shown · non-contrast
Comparison: Abdominal evaluation of the same date.

CLINICAL DATA: A 76-year-old female presents for evaluation of
shunt malfunction.

EXAM:
CHEST  1 VIEW

[chest ap]
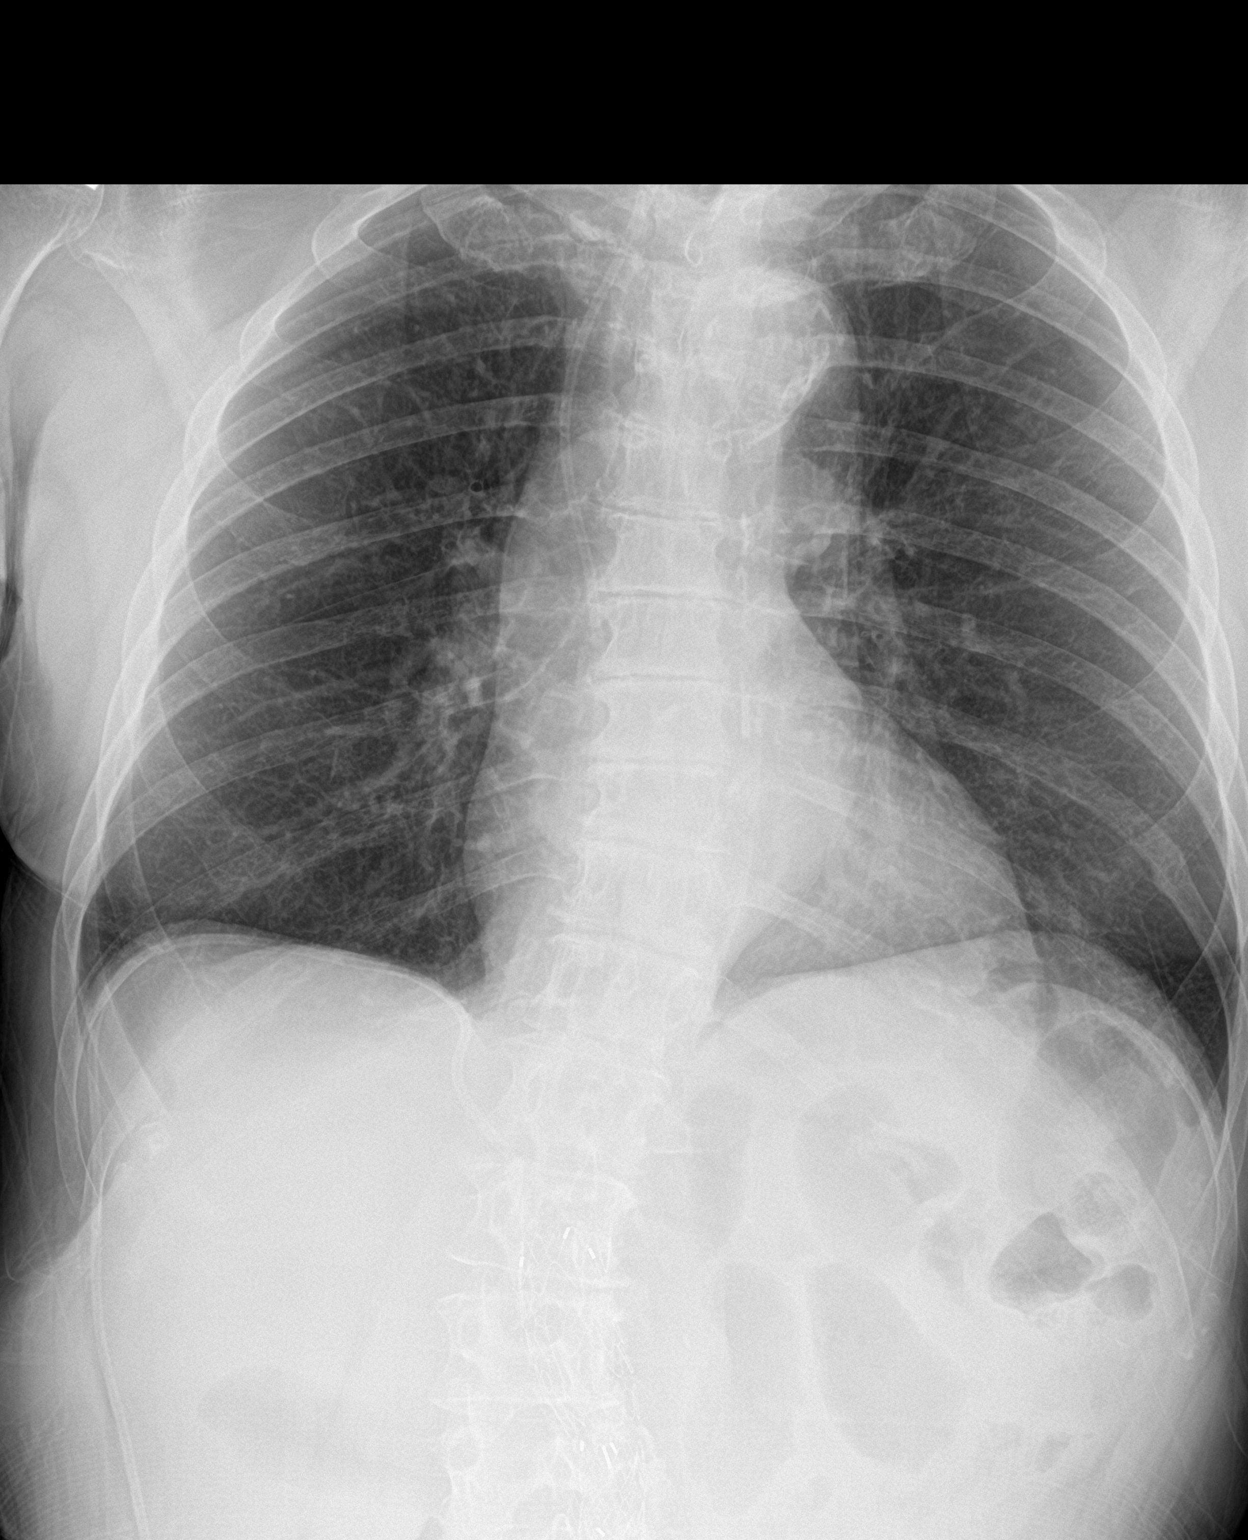

[1 of 1 positions shown; findings below may reference images not displayed]

FINDINGS: Shunt tubing courses from the neck into the chest along the RIGHT
mediastinal border and then passes into the abdomen coursing above
the liver.

No visible signs of discontinuity or calcification.

Cardiomediastinal contours and hilar structures are normal. No lobar
consolidation. No sign of pleural effusion. No visible pneumothorax.

On limited assessment there is no acute skeletal finding.

No free air beneath either the RIGHT or LEFT hemidiaphragm.
IMPRESSION: 1. No visible signs of discontinuity or calcification.
2. No acute cardiopulmonary process.

## 2023-03-18 ENCOUNTER — Other Ambulatory Visit: Payer: Self-pay | Admitting: Internal Medicine

## 2023-03-18 DIAGNOSIS — I10 Essential (primary) hypertension: Secondary | ICD-10-CM

## 2023-03-19 NOTE — Telephone Encounter (Signed)
Courtesy refill future visit tomorrow.  Requested Prescriptions  Pending Prescriptions Disp Refills   lisinopril (ZESTRIL) 20 MG tablet [Pharmacy Med Name: LISINOPRIL 20 MG TAB] 90 tablet 0    Sig: TAKE ONE TABLET (20 MG) BY MOUTH EVERY DAY     Cardiovascular:  ACE Inhibitors Failed - 03/18/2023  9:40 AM      Failed - Valid encounter within last 6 months    Recent Outpatient Visits           6 months ago Essential hypertension   [place] [facility] Margarita Mail, DO   1 year ago Essential hypertension   [facility] Margarita Mail, DO   1 year ago Essential hypertension   [facility] Margarita Mail, DO   1 year ago Essential hypertension    [facility] Margarita Mail, DO       Future Appointments             Tomorrow Margarita Mail, DO  [facility]   In 3 months  [facility]            Passed - Cr in normal range and within 180 days    Creat  Date Value Ref Range Status  09/25/2022 0.88 0.60 - 1.00 mg/dL Final         Passed - K in normal range and within 180 days    Potassium  Date Value Ref Range Status  09/25/2022 5.2 3.5 - 5.3 mmol/L Final         Passed - Patient is not pregnant      Passed - Last BP in normal range    BP Readings from Last 1 Encounters:  12/23/22 97/65

## 2023-03-19 NOTE — Progress Notes (Signed)
Established Patient Office Visit  Subjective:  Patient ID: Tracy Parsons, female    DOB: 1946-09-23  Age: 77 y.o. MRN: [id number]  CC:  Chief Complaint  Patient presents with   Follow-up    HPI Tracy Parsons presents for follow up on chronic medical conditions. Patient denies any changes since last office visit.   Hypertension: -Medications: Lisinopril 20 mg -Patient is compliant with above medications and reports no side effects. -Checking BP at home (average): not checking currently  -Denies any SOB, CP, vision changes, LE edema or symptoms of hypotension  AAA/PAD: -CTA 12/22 showing AAA measuring 5.2 x 3.6 cm, recheck in 3/24 diameter 3.6 cm -Underwent open endovascular repair with multiple stents placed 12/04/21 at [facility], recovering well.  -Following with vascular surgery, last seen 12/23/22 -Currently on Cilostazol, had been taking aspirin previously but stopped recently  -Working with physical therapy short walks, exercise bike and doing well   HLD/CAD/PAD: -Medications: Lovastatin 40 mg -Patient is compliant with above medications and reports no side effects.  -Last lipid panel: Lipid Panel     Component Value Date/Time   CHOL 231 (H) 09/25/2022 1003   TRIG 117 09/25/2022 1003   HDL 63 09/25/2022 1003   CHOLHDL 3.7 09/25/2022 1003   LDLCALC 145 (H) 09/25/2022 1003   The 10-year ASCVD risk score (Arnett DK, et al., 2019) is: 29.4%   Values used to calculate the score:     Age: 69 years     Sex: Female     Is Non-Hispanic African American: No     Diabetic: No     Tobacco smoker: Yes     Systolic Blood Pressure: 106 mmHg     Is BP treated: Yes     HDL Cholesterol: 63 mg/dL     Total Cholesterol: 231 mg/dL  Hypothyroidism: -Medications: Levothyroxine 75 mcg, been on this dose for years -Patient is compliant with the above medication (s) at the above dose and reports no medication side effects.  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 Small chronic subdural hematoma overlying left cerebral hemisphere measuring 0.6 cm which is decreased in size.  -Was seen in the ER 03/07/22 for headache. Had 2 head CT's, first showed moderate hydrocephalus despite ventriculostomy catheter in place. Trace 2 mm thick left cerebral convexity subdural thickening vs. Subdural hematoma, possibly chronic with patchy periventricular white matter hypo-densities nonspecific possible representing periventricular edema vs chronic microvascular disease. Second a few hours later showed unchanged thin left cerebral convexity subdural thickening vs minimal subdural hematoma without associated mass effect. Stable hydrocephalus with ventriculostomy catheter in place.  -Seen by Neurosurgery on 02/27/22  MDD: -Mood status: stable -Current treatment: Zoloft tapered down from 200 mg, had been off the medication for several weeks and had been doing well  -Satisfied with current treatment?: no -Symptom severity: mild  -Duration of current treatment : years Medication compliance: excellent compliance     03/20/2023    1:02 PM 06/26/2022   10:56 AM 06/26/2022   10:53 AM 03/18/2022    1:06 PM 02/17/2022    3:03 PM  Depression screen PHQ 2/9  Decreased Interest  0 0 0 0 3  Down, Depressed, Hopeless 0 0 0 0 2  PHQ - 2 Score 0 0 0 0 5  Altered sleeping 0   0 1  Tired, decreased energy 0   0 1  Change in appetite 1   0 1  Feeling bad or failure about yourself  0   0 0  Trouble concentrating 0   0 1  Moving slowly or fidgety/restless 0   0 1  Suicidal thoughts 0   0 0   PHQ-9 Score 1   0 10  Difficult doing work/chores Not difficult at all   Not difficult at all Somewhat difficult   Health Maintenance: -Blood work UTD -Due to patient request, all screenings such as mammograms, DEXA, colonoscopies will be discontinued   Past Medical History:  Diagnosis Date   Anemia    Aortic atherosclerosis (HCC)    Atherosclerosis of native arteries of extremity with intermittent claudication (HCC)    CAD (coronary artery disease)    Depression    Diastolic dysfunction    a.) TTE 11/18/2021: EF 60-65%; triv MR; G1DD   Heart murmur    History of kidney stones    HLD (hyperlipidemia)    Hypertension    Hypothyroidism    Infrarenal abdominal aortic aneurysm (AAA) without rupture (HCC)    a.) CTA 09/16/2021: infrarenal AAA with mural thrombus measuring 5.2 x 3.6 cm   Long term current use of antithrombotics/antiplatelets    a.) ASA + cilostazol   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[facility])     Past Surgical History:  Procedure Laterality Date   ABDOMINAL HYSTERECTOMY     ENDOVASCULAR REPAIR/STENT GRAFT N/A 12/04/2021   Procedure: ENDOVASCULAR REPAIR/STENT GRAFT;  Surgeon: Annice Needy, MD;  Location: [facility] INVASIVE CV LAB;  Service: Cardiovascular;  Laterality: N/A;   EYE SURGERY Bilateral    NASAL SINUS SURGERY  1970   shunt in head  2022    Family History  Adopted: Yes    Social History   Socioeconomic History   Marital status: Married    Spouse name: Not on file   Number of children: Not on file   Years of education: Not on file   Highest education level: Not  on file  Occupational History   Not on file  Tobacco Use   Smoking status: Every Day    Packs/day: .25    Types: Cigarettes   Smokeless tobacco: Never  Vaping Use   Vaping Use: Never used  Substance and Sexual Activity   Alcohol use: Yes    Alcohol/week: 1.0 - 2.0 standard drink of alcohol    Types: 1 - 2 Glasses of wine per week   Drug use: Never   Sexual activity: Not Currently  Other Topics Concern   Not on file  Social History Narrative   Not on file   Social Determinants of Health   Financial Resource Strain: Low Risk  (06/26/2022)   Overall Financial Resource Strain (CARDIA)    Difficulty of Paying Living Expenses: Not hard at all  Food Insecurity: No  Food Insecurity (06/26/2022)   Hunger Vital Sign    Worried About Running Out of Food in the Last Year: Never true    Ran Out of Food in the Last Year: Never true  Transportation Needs: No Transportation Needs (06/26/2022)   PRAPARE - Administrator, Civil Service (Medical): No    Lack of Transportation (Non-Medical): No  Physical Activity: Inactive (06/26/2022)   Exercise Vital Sign    Days of Exercise per Week: 0 days    Minutes of Exercise per Session: 0 min  Stress: No Stress Concern Present (06/26/2022)   [employer] of Occupational Health - Occupational Stress Questionnaire    Feeling of Stress : Not at all  Social Connections: Moderately Isolated (06/26/2022)   Social Connection and Isolation Panel [NHANES]    Frequency of Communication with Friends and Family: More than three times a week    Frequency of Social Gatherings with Friends and Family: More than three times a week    Attends Religious Services: Never    Database administrator or Organizations: No    Attends Banker Meetings: Never    Marital Status: Married  Catering manager Violence: Not At Risk (06/26/2022)   Humiliation, Afraid, Rape, and Kick questionnaire    Fear of Current or Ex-Partner: No    Emotionally Abused:  No    Physically Abused: No    Sexually Abused: No    ROS Review of Systems  Constitutional:  Negative for chills and fever.  Eyes:  Negative for visual disturbance.  Respiratory:  Negative for cough.   Cardiovascular:  Negative for chest pain.  Gastrointestinal:  Negative for abdominal pain.  Neurological:  Negative for dizziness.    Objective:   Today's Vitals: BP 106/72 (BP Location: Right Arm, Patient Position: Sitting, Cuff Size: Small)   Pulse 89   Temp 98.2 F (36.8 C) (Oral)   Resp 14   Ht 5' (1.524 m) Comment: per patient  Wt 122 lb 8 oz (55.6 kg)   SpO2 100%   BMI 23.92 kg/m   Physical Exam Constitutional:      Appearance: Normal appearance.     Comments: Presents with walker  HENT:     Head: Normocephalic and atraumatic.  Eyes:     Conjunctiva/sclera: Conjunctivae normal.  Cardiovascular:     Rate and Rhythm: Normal rate and regular rhythm.  Pulmonary:     Effort: Pulmonary effort is normal.     Breath sounds: Normal breath sounds.  Skin:    General: Skin is warm and dry.  Neurological:     General: No focal deficit present.     Mental Status: She is alert. Mental status is at baseline.  Psychiatric:        Mood and Affect: Mood normal.        Behavior: Behavior normal.     Assessment & Plan:   1. Essential hypertension: Blood pressure on the lower side today, patient is asymptomatic.  Patient does not monitor her blood pressure at home.  Will decrease lisinopril to 10 mg daily.  Patient lives in a retirement home where they do blood pressure checks every couple weeks, she will attend these to have her blood pressure checked and let me know if it is elevated.  - lisinopril (ZESTRIL) 10 MG tablet; Take 1 tablet (10 mg total) by mouth daily.  Dispense: 90 tablet; Refill: 3  2. Pure hypercholesterolemia: Reviewed cholesterol panel with the patient, LDL and ASCVD risk  elevated despite being on lovastatin 40 mg daily.  Patient had been on Lipitor  previously but could not tolerate.  Willing to try increased dose of lovastatin to 60 mg daily.  Plan to recheck fasting labs at follow-up.  - lovastatin (ALTOPREV) 60 MG 24 hr tablet; Take 1 tablet (60 mg total) by mouth at bedtime.  Dispense: 90 tablet; Refill: 1  3. PAD (peripheral artery disease) [facility]): Doing well, following with vascular surgery, note reviewed from 12/23/2022.  Continue Cilostazol, refilled.  - cilostazol (PLETAL) 50 MG tablet; Take 1 tablet (50 mg total) by mouth 2 (two) times daily.  Dispense: 180 tablet; Refill: 1  4. Hypothyroidism, unspecified type: Refill levothyroxine 75 mcg daily, recheck labs at follow-up.  - levothyroxine (SYNTHROID) 75 MCG tablet; Take 1 tablet (75 mcg total) by mouth daily.  Dispense: 90 tablet; Refill: 1   Follow-up: Return in about 6 months (around 09/19/2023).   Margarita Mail, DO

## 2023-03-20 ENCOUNTER — Encounter: Payer: Self-pay | Admitting: Internal Medicine

## 2023-03-20 ENCOUNTER — Ambulatory Visit: Payer: Medicare Other | Admitting: Internal Medicine

## 2023-03-20 ENCOUNTER — Telehealth: Payer: Self-pay | Admitting: Internal Medicine

## 2023-03-20 VITALS — BP 106/72 | HR 89 | Temp 98.2°F | Resp 14 | Ht 60.0 in | Wt 122.5 lb

## 2023-03-20 DIAGNOSIS — I739 Peripheral vascular disease, unspecified: Secondary | ICD-10-CM

## 2023-03-20 DIAGNOSIS — I10 Essential (primary) hypertension: Secondary | ICD-10-CM | POA: Diagnosis not present

## 2023-03-20 DIAGNOSIS — E039 Hypothyroidism, unspecified: Secondary | ICD-10-CM | POA: Diagnosis not present

## 2023-03-20 DIAGNOSIS — E7800 Pure hypercholesterolemia, unspecified: Secondary | ICD-10-CM | POA: Diagnosis not present

## 2023-03-20 MED ORDER — LEVOTHYROXINE SODIUM 75 MCG PO TABS: 75.0000 ug | ORAL_TABLET | Freq: Every day | ORAL | 1 refills | Status: DC

## 2023-03-20 MED ORDER — LISINOPRIL 10 MG PO TABS: 10.0000 mg | ORAL_TABLET | Freq: Every day | ORAL | 3 refills | Status: DC

## 2023-03-20 MED ORDER — CILOSTAZOL 50 MG PO TABS: 50.0000 mg | ORAL_TABLET | Freq: Two times a day (BID) | ORAL | 1 refills | Status: DC

## 2023-03-20 MED ORDER — LOVASTATIN ER 60 MG PO TB24: 60.0000 mg | ORAL_TABLET | Freq: Every day | ORAL | 1 refills | Status: DC

## 2023-03-20 NOTE — Patient Instructions (Signed)
It was great seeing you today!  Plan discussed at today's visit: -Decrease Lisinopril to 10 mg daily (cut pill in half) -Increase statin to 60 mg daily -Plan for fasting labs at follow up  Follow up in: 6 months   Take care and let us know if you have any questions or concerns prior to your next visit.  Dr. Caralee Ates

## 2023-03-25 ENCOUNTER — Telehealth: Payer: Self-pay | Admitting: Internal Medicine

## 2023-03-25 NOTE — Telephone Encounter (Signed)
Tracy Parsons with [facility] states they do not have Lovastatin 60 mg 24 hour tablet. They do have Lovastatin 40 mg 24 hour tablet. Please advise.

## 2023-03-26 ENCOUNTER — Other Ambulatory Visit: Payer: Self-pay | Admitting: Internal Medicine

## 2023-07-02 ENCOUNTER — Ambulatory Visit: Payer: Medicare Other

## 2023-08-13 ENCOUNTER — Other Ambulatory Visit: Payer: Self-pay | Admitting: Internal Medicine

## 2023-08-13 DIAGNOSIS — E039 Hypothyroidism, unspecified: Secondary | ICD-10-CM

## 2023-08-13 NOTE — Telephone Encounter (Signed)
Requested Prescriptions  Refused Prescriptions Disp Refills   levothyroxine (SYNTHROID) 75 MCG tablet [Pharmacy Med Name: LEVOTHYROXINE SODIUM 75 MCG TAB] 90 tablet 1    Sig: TAKE 1 TABLET EVERY DAY ON EMPTY STOMACHWITH A GLASS OF WATER AT LEAST 30-60 MINBEFORE BREAKFAST     Endocrinology:  Hypothyroid Agents Failed - 08/13/2023  2:03 PM      Failed - TSH in normal range and within 360 days    TSH  Date Value Ref Range Status  09/25/2022 5.92 (H) 0.40 - 4.50 mIU/L Final         Passed - Valid encounter within last 12 months    Recent Outpatient Visits           4 months ago Essential hypertension   [facility] Margarita Mail, DO   10 months ago Essential hypertension   [facility] Margarita Mail, DO   1 year ago Essential hypertension   [facility] Margarita Mail, DO   1 year ago Essential hypertension   [facility] Margarita Mail, DO   1 year ago Essential hypertension   [facility] Margarita Mail, DO       Future Appointments             In 1 month Margarita Mail, DO [facility], [facility]

## 2023-08-22 ENCOUNTER — Observation Stay
Admission: EM | Admit: 2023-08-22 | Discharge: 2023-08-23 | Disposition: A | Discharge status: Home / Self Care | Payer: Medicare Other | Attending: Internal Medicine | Admitting: Internal Medicine

## 2023-08-22 ENCOUNTER — Emergency Department: Payer: Medicare Other

## 2023-08-22 ENCOUNTER — Other Ambulatory Visit: Payer: Self-pay

## 2023-08-22 DIAGNOSIS — M6281 Muscle weakness (generalized): Secondary | ICD-10-CM | POA: Diagnosis not present

## 2023-08-22 DIAGNOSIS — I10 Essential (primary) hypertension: Secondary | ICD-10-CM | POA: Diagnosis present

## 2023-08-22 DIAGNOSIS — Z79899 Other long term (current) drug therapy: Secondary | ICD-10-CM | POA: Diagnosis not present

## 2023-08-22 DIAGNOSIS — Z720 Tobacco use: Secondary | ICD-10-CM | POA: Diagnosis present

## 2023-08-22 DIAGNOSIS — R55 Syncope and collapse: Principal | ICD-10-CM | POA: Diagnosis present

## 2023-08-22 DIAGNOSIS — F17210 Nicotine dependence, cigarettes, uncomplicated: Secondary | ICD-10-CM | POA: Diagnosis not present

## 2023-08-22 DIAGNOSIS — I5032 Chronic diastolic (congestive) heart failure: Secondary | ICD-10-CM | POA: Diagnosis present

## 2023-08-22 DIAGNOSIS — E039 Hypothyroidism, unspecified: Secondary | ICD-10-CM | POA: Diagnosis present

## 2023-08-22 DIAGNOSIS — G912 (Idiopathic) normal pressure hydrocephalus: Secondary | ICD-10-CM | POA: Diagnosis present

## 2023-08-22 DIAGNOSIS — N390 Urinary tract infection, site not specified: Secondary | ICD-10-CM | POA: Diagnosis not present

## 2023-08-22 DIAGNOSIS — E785 Hyperlipidemia, unspecified: Secondary | ICD-10-CM | POA: Diagnosis present

## 2023-08-22 DIAGNOSIS — I110 Hypertensive heart disease with heart failure: Secondary | ICD-10-CM | POA: Diagnosis not present

## 2023-08-22 DIAGNOSIS — Z7902 Long term (current) use of antithrombotics/antiplatelets: Secondary | ICD-10-CM | POA: Diagnosis not present

## 2023-08-22 DIAGNOSIS — R7989 Other specified abnormal findings of blood chemistry: Secondary | ICD-10-CM | POA: Diagnosis not present

## 2023-08-22 DIAGNOSIS — E876 Hypokalemia: Secondary | ICD-10-CM | POA: Diagnosis present

## 2023-08-22 DIAGNOSIS — N289 Disorder of kidney and ureter, unspecified: Secondary | ICD-10-CM

## 2023-08-22 DIAGNOSIS — N2889 Other specified disorders of kidney and ureter: Secondary | ICD-10-CM | POA: Insufficient documentation

## 2023-08-22 DIAGNOSIS — I739 Peripheral vascular disease, unspecified: Secondary | ICD-10-CM | POA: Diagnosis present

## 2023-08-22 DIAGNOSIS — I959 Hypotension, unspecified: Secondary | ICD-10-CM | POA: Diagnosis present

## 2023-08-22 DIAGNOSIS — A419 Sepsis, unspecified organism: Secondary | ICD-10-CM | POA: Insufficient documentation

## 2023-08-22 DIAGNOSIS — R2681 Unsteadiness on feet: Secondary | ICD-10-CM | POA: Insufficient documentation

## 2023-08-22 DIAGNOSIS — I2510 Atherosclerotic heart disease of native coronary artery without angina pectoris: Secondary | ICD-10-CM | POA: Diagnosis not present

## 2023-08-22 DIAGNOSIS — R6520 Severe sepsis without septic shock: Secondary | ICD-10-CM | POA: Diagnosis present

## 2023-08-22 DIAGNOSIS — I714 Abdominal aortic aneurysm, without rupture, unspecified: Secondary | ICD-10-CM | POA: Diagnosis present

## 2023-08-22 DIAGNOSIS — I7143 Infrarenal abdominal aortic aneurysm, without rupture: Secondary | ICD-10-CM

## 2023-08-22 LAB — URINALYSIS, ROUTINE W REFLEX MICROSCOPIC
Bilirubin Urine: NEGATIVE
Glucose, UA: NEGATIVE mg/dL
Ketones, ur: NEGATIVE mg/dL
Leukocytes,Ua: NEGATIVE
Nitrite: POSITIVE — AB
Protein, ur: NEGATIVE mg/dL
Specific Gravity, Urine: 1.021 (ref 1.005–1.030)
pH: 6 (ref 5.0–8.0)

## 2023-08-22 LAB — COMPREHENSIVE METABOLIC PANEL
ALT: 11 U/L (ref 0–44)
AST: 14 U/L — ABNORMAL LOW (ref 15–41)
Albumin: 3.9 g/dL (ref 3.5–5.0)
Alkaline Phosphatase: 74 U/L (ref 38–126)
Anion gap: 11 (ref 5–15)
BUN: 16 mg/dL (ref 8–23)
CO2: 20 mmol/L — ABNORMAL LOW (ref 22–32)
Calcium: 8.9 mg/dL (ref 8.9–10.3)
Chloride: 104 mmol/L (ref 98–111)
Creatinine, Ser: 1.03 mg/dL — ABNORMAL HIGH (ref 0.44–1.00)
GFR, Estimated: 55 mL/min — ABNORMAL LOW (ref >60)
Glucose, Bld: 144 mg/dL — ABNORMAL HIGH (ref 70–99)
Potassium: 3.3 mmol/L — ABNORMAL LOW (ref 3.5–5.1)
Sodium: 135 mmol/L (ref 135–145)
Total Bilirubin: 0.4 mg/dL (ref <1.2)
Total Protein: 7.3 g/dL (ref 6.5–8.1)

## 2023-08-22 LAB — CBC
HCT: 38.7 % (ref 36.0–46.0)
Hemoglobin: 12.7 g/dL (ref 12.0–15.0)
MCH: 30.8 pg (ref 26.0–34.0)
MCHC: 32.8 g/dL (ref 30.0–36.0)
MCV: 93.9 fL (ref 80.0–100.0)
Platelets: 337 10*3/uL (ref 150–400)
RBC: 4.12 MIL/uL (ref 3.87–5.11)
RDW: 14.3 % (ref 11.5–15.5)
WBC: 9.9 10*3/uL (ref 4.0–10.5)
nRBC: 0 % (ref 0.0–0.2)

## 2023-08-22 LAB — PHOSPHORUS: Phosphorus: 3 mg/dL (ref 2.5–4.6)

## 2023-08-22 LAB — MAGNESIUM: Magnesium: 2.2 mg/dL (ref 1.7–2.4)

## 2023-08-22 LAB — TROPONIN I (HIGH SENSITIVITY)
Troponin I (High Sensitivity): 4 ng/L (ref <18)
Troponin I (High Sensitivity): 5 ng/L (ref <18)

## 2023-08-22 LAB — LACTIC ACID, PLASMA
Lactic Acid, Venous: 2.7 mmol/L (ref 0.5–1.9)
Lactic Acid, Venous: 1 mmol/L (ref 0.5–1.9)

## 2023-08-22 LAB — LIPASE, BLOOD: Lipase: 33 U/L (ref 11–51)

## 2023-08-22 LAB — BRAIN NATRIURETIC PEPTIDE: B Natriuretic Peptide: 24 pg/mL (ref 0.0–100.0)

## 2023-08-22 MED ORDER — ONDANSETRON HCL 4 MG/2ML IJ SOLN: 4.0000 mg | Freq: Three times a day (TID) | INTRAMUSCULAR | Status: DC | PRN

## 2023-08-22 MED ORDER — SODIUM CHLORIDE 0.9 % IV BOLUS
500.0000 mL | Freq: Once | INTRAVENOUS | Status: AC
  Administered 2023-08-22: 500 mL via INTRAVENOUS

## 2023-08-22 MED ORDER — ALBUTEROL SULFATE HFA 108 (90 BASE) MCG/ACT IN AERS
2.0000 | INHALATION_SPRAY | RESPIRATORY_TRACT | Status: DC | PRN
Start: 2023-08-22 — End: 2023-08-22

## 2023-08-22 MED ORDER — IOHEXOL 350 MG/ML SOLN
100.0000 mL | Freq: Once | INTRAVENOUS | Status: AC | PRN
  Administered 2023-08-22: 100 mL via INTRAVENOUS

## 2023-08-22 MED ORDER — SODIUM CHLORIDE 0.9 % IV SOLN: INTRAVENOUS | Status: DC

## 2023-08-22 MED ORDER — NICOTINE 21 MG/24HR TD PT24: 21.0000 mg | MEDICATED_PATCH | Freq: Every day | TRANSDERMAL | Status: DC

## 2023-08-22 MED ORDER — HYDRALAZINE HCL 20 MG/ML IJ SOLN: 5.0000 mg | INTRAMUSCULAR | Status: DC | PRN

## 2023-08-22 MED ORDER — ALBUTEROL SULFATE (2.5 MG/3ML) 0.083% IN NEBU: 2.5000 mg | INHALATION_SOLUTION | RESPIRATORY_TRACT | Status: DC | PRN

## 2023-08-22 MED ORDER — LORAZEPAM 2 MG/ML IJ SOLN: 2.0000 mg | INTRAMUSCULAR | Status: DC | PRN

## 2023-08-22 MED ORDER — ACETAMINOPHEN 325 MG PO TABS: 650.0000 mg | ORAL_TABLET | Freq: Four times a day (QID) | ORAL | Status: DC | PRN

## 2023-08-22 MED ORDER — POTASSIUM CHLORIDE CRYS ER 20 MEQ PO TBCR
40.0000 meq | EXTENDED_RELEASE_TABLET | Freq: Once | ORAL | Status: AC
  Administered 2023-08-22: 40 meq via ORAL
  Filled 2023-08-22: qty 2

## 2023-08-22 MED ORDER — ENOXAPARIN SODIUM 40 MG/0.4ML IJ SOSY
40.0000 mg | PREFILLED_SYRINGE | INTRAMUSCULAR | Status: DC
Start: 2023-08-23 — End: 2023-08-23
  Administered 2023-08-23: 40 mg via SUBCUTANEOUS
  Filled 2023-08-22: qty 0.4

## 2023-08-22 NOTE — ED Triage Notes (Signed)
Pt bib [facility] from [facility] cx of witnessed seizure. Pt was sitting in chair when she had "seizure".  Pt states she got light headed and then had a seizure. Pt vomited once she came back to consciousness. Pt declines hx of seizures, blood thinners or recent illness. Pt presents in NAD, talking and making conversation.  106/70 106 hr

## 2023-08-22 NOTE — ED Provider Notes (Signed)
[facility] Provider Note    Event Date/Time   First MD Initiated Contact with Patient 08/22/23 1841     (approximate)   History   Seizures   HPI  Tracy Parsons is a 77 y.o. female here for evaluation of possible "seizure"  Was at the dining area of her care facility when she started feeling very lightheaded.  She was witnessed to pass out.  She shook just briefly.  She did not fall or suffer injury.  Her husband or staff witnessed it.  She also reports that she has been feeling a bit lightheaded when she sits up and felt a little lightheaded preceding this.  She is otherwise been in normal health no pain no discomfort no fevers no chills.    No numbness no weakness no difficulty speaking.  She feels just fine now.  Physical Exam   Triage Vital Signs: ED Triage Vitals  Encounter Vitals Group     BP 08/22/23 1844 (!) 81/62     Systolic BP Percentile --      Diastolic BP Percentile --      Pulse Rate 08/22/23 1840 (!) 107     Resp 08/22/23 1844 (!) 21     Temp 08/22/23 1844 98.1 F (36.7 C)     Temp Source 08/22/23 1844 Oral     SpO2 08/22/23 1844 96 %     Weight 08/22/23 1841 125 lb (56.7 kg)     Height 08/22/23 1841 5\' 5"  (1.651 m)     Head Circumference --      Peak Flow --      Pain Score --      Pain Loc --      Pain Education --      Exclude from Growth Chart --     Most recent vital signs: Vitals:   08/23/23 0825 08/23/23 1227  BP: (!) 153/80 130/86  Pulse: 88 89  Resp: 18 18  Temp: 97.8 F (36.6 C) 99 F (37.2 C)  SpO2: 99% 99%    Dining area General: Awake, no distress.  CV:  Good peripheral perfusion.  Warm well-perfused. Resp:  Normal effort.  Clear bilateral Abd:  No distention.  Soft nontender nondistended Other:  No lower extremity venous cords congestion or edema.  She moves all extremities well with normal neurologic exam moves all extremities well facial expressions normal extraocular movements normal.   Speech is clear no facial droop   ED Results / Procedures / Treatments   Labs (all labs ordered are listed, but only abnormal results are displayed) Labs Reviewed  URINE CULTURE - Abnormal; Notable for the following components:      Result Value   Culture   (*)    Value: >=100,000 COLONIES/mL ESCHERICHIA COLI 80,000 COLONIES/mL AEROCOCCUS SPECIES Standardized susceptibility testing for this organism is not available. Performed at [facility], [address] [place], [place] [address]    Organism ID, Bacteria ESCHERICHIA COLI (*)    All other components within normal limits  COMPREHENSIVE METABOLIC PANEL - Abnormal; Notable for the following components:   Potassium 3.3 (*)    CO2 20 (*)    Glucose, Bld 144 (*)    Creatinine, Ser 1.03 (*)    AST 14 (*)    GFR, Estimated 55 (*)    All other components within normal limits  URINALYSIS, ROUTINE W REFLEX MICROSCOPIC - Abnormal; Notable for the following components:   Color, Urine YELLOW (*)  APPearance HAZY (*)    Hgb urine dipstick MODERATE (*)    Nitrite POSITIVE (*)    Bacteria, UA RARE (*)    All other components within normal limits  LACTIC ACID, PLASMA - Abnormal; Notable for the following components:   Lactic Acid, Venous 2.7 (*)    All other components within normal limits  BASIC METABOLIC PANEL - Abnormal; Notable for the following components:   Chloride 113 (*)    CO2 20 (*)    Calcium 8.1 (*)    All other components within normal limits  CBC - Abnormal; Notable for the following components:   RBC 3.60 (*)    Hemoglobin 11.3 (*)    HCT 34.1 (*)    All other components within normal limits  CULTURE, BLOOD (ROUTINE X 2)  CULTURE, BLOOD (ROUTINE X 2)  CBC  LIPASE, BLOOD  LACTIC ACID, PLASMA  MAGNESIUM  PHOSPHORUS  BRAIN NATRIURETIC PEPTIDE  TROPONIN I (HIGH SENSITIVITY)  TROPONIN I (HIGH SENSITIVITY)     EKG  And interpreted me at 1840 heart rate 105 QRS 70 QTc 430 Sinus tachycardia.  Low  voltage is noted.  Mild nonspecific T wave abnormality.   RADIOLOGY    PROCEDURES:  Critical Care performed: Yes, see critical care procedure note(s)  CRITICAL CARE Performed by: Sharyn Creamer   Total critical care time: 30 minutes  Critical care time was exclusive of separately billable procedures and treating other patients.  Critical care was necessary to treat or prevent imminent or life-threatening deterioration.  Critical care was time spent personally by me on the following activities: development of treatment plan with patient and/or surrogate as well as nursing, discussions with consultants, evaluation of patient's response to treatment, examination of patient, obtaining history from patient or surrogate, ordering and performing treatments and interventions, ordering and review of laboratory studies, ordering and review of radiographic studies, pulse oximetry and re-evaluation of patient's condition.  Procedures   MEDICATIONS ORDERED IN ED: Medications  sodium chloride 0.9 % bolus 500 mL (0 mLs Intravenous Stopped 08/22/23 2143)  sodium chloride 0.9 % bolus 500 mL (0 mLs Intravenous Stopped 08/22/23 2143)  iohexol (OMNIPAQUE) 350 MG/ML injection 100 mL (100 mLs Intravenous Contrast Given 08/22/23 2048)  potassium chloride SA (KLOR-CON M) CR tablet 40 mEq (40 mEq Oral Given 08/22/23 2357)  sodium chloride 0.9 % bolus 500 mL (0 mLs Intravenous Stopped 08/23/23 0030)     IMPRESSION / MDM / ASSESSMENT AND PLAN / ED COURSE  I reviewed the triage vital signs and the nursing notes.                              Differential diagnosis includes, but is not limited to, possible syncope less likely seizure other considerations such as anemia, ACS, etc. are given.  The patient has a fairly significant hypotension though she seems to be asymptomatic.  At this point I am quite concerned that she had a syncopal episode associated with her lightheadedness and hypotension.  Fluid  resuscitation ordered in the ER which was with improvement, steady improvement in blood pressures.  As further evaluation, and some delay in obtaining urinalysis, her urine appears to be a potential source.  Concern for severe sepsis.  Code sepsis was initiated discussed with hospitalist as the patient is admitted, hospitalist admitting aware and has ordered antibiotic/Rocephin.  CT imaging was performed to exclude ruptured aneurysm given the history of known AAA, this was reassuring.  She has no central neurologic features at this time.  She is awake alert oriented blood pressure improving.    On sepsis reassessment condition improving blood pressure improving  Patient's presentation is most consistent with acute presentation with potential threat to life or bodily function.   The patient is on the cardiac monitor to evaluate for evidence of arrhythmia and/or significant heart rate changes.  Labs demonstrate no acute anemia.  Mild hypokalemia.  Urinalysis concerning for UTI.  Troponin and BNP normal.   Clinical Course as of 09/09/23 0716  Sat Aug 22, 2023  2048 Patient blood pressure now 100 systolic.  Receiving additional fluids.  Fully awake and alert.  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  Discussed with patient and family we will proceed with CT angiography to exclude endovascular leak or other obvious cause for hypotension. [MQ]    Clinical Course User Index [MQ] Sharyn Creamer, MD   ----------------------------------------- 8:03 PM on 08/22/2023 ----------------------------------------- Blood pressures improved to 90/70 systolic.  She is fully awake alert oriented.  Discussed with the patient and family, had a lengthy discussion about recommendation for admission.  She is agreeable with this she is though asymptomatic still hypotensive.  Additional fluid given.  She denies any pain burning discomfort any urinary symptoms cough  headache weakness.  She is essentially asymptomatic but obviously had what appears to been a brief syncopal episode associated with hypotension.  The cause is not yet clear.  Will obtain chest x-ray, urinalysis, lactic acid pending, but also think patient would likely benefit from further cardiac workup possible echocardiogram, cardiac monitoring etc.     0003 Patient's urinalysis concerning for possible UTI.  Patient has been moved to the hospital floor at the time of result review.  I have initiated code sepsis.  Hospitalist has ordered Rocephin, updated Dr. Clyde Lundborg.  Her hypotension has since resolved, but given the persistence of hypotension and elevated lactic patient meets criteria for severe sepsis.  Code sepsis initiated   Sharyn Creamer, MD 08/23/23 0003  FINAL CLINICAL IMPRESSION(S) / ED DIAGNOSES   Final diagnoses:  Syncope and collapse  Severe hypotension  Urinary tract infection, acute  Severe sepsis [facility])    Note:  This document was prepared using Dragon voice recognition software and may include unintentional dictation errors.   Sharyn Creamer, MD 09/09/23 [phone]

## 2023-08-22 NOTE — H&P (Signed)
History and Physical    Tracy Parsons ZOX:[id number] DOB: June 18, 1946 DOA: 08/22/2023  Referring MD/NP/PA:   PCP: Margarita Mail, DO   Patient coming from:  The patient is coming from senior living facility   Chief Complaint: Syncope  HPI: Tracy Parsons is a 77 y.o. female with medical history significant of hypertension, hyperlipidemia, PVD, CAD, diastolic CHF, hypothyroidism, kidney stone, SDH, NPH (s/p of VP shunt), AAA, tobacco abuse, who presents with syncope.  Per report, pt had episode of seizure in senior living facility, however patient denies seizure to me.  She reports that she had lightheadedness and passed out shortly.  She said that she had 1 episode of nausea and vomiting which has resolved.  Currently no nausea, vomiting, diarrhea or abdominal pain.  No symptoms of UTI.  Denies chest pain, cough, SOB.  No fever or chills.  No symptoms of UTI.  She moves all extremities normally.  No facial droop or slurred speech.  Patient was found to have hypotension with blood pressure 81/62, which improved to 118/78 after giving total of 1 L normal saline bolus in ED.  Data reviewed independently and ED Course: pt was found to have WBC 9.9, lactic acid 2.7, troponin level 4 --> 5, creatinine 1.03, potassium 3.3, temperature normal, heart rate 107, 95, RR 21, oxygen saturation 98% on room air.  Chest x-ray negative.  CT of head is negative for acute intracranial abnormalities.  CTA is negative for aortic dissection or rupture of aneurysm.  Patient is placed on telemetry bed for ablation.  CT of head: 1. No acute intracranial abnormality. 2. Postoperative changes from prior bifrontal craniotomy with right posterior approach VP shunt catheter in place. Underlying hydrocephalus with periventricular hypodensity, relatively stable from prior.    CTA of the chest/abdomen/pelvis:  1. Intact aortobifemoral graft across mild fusiform infrarenal abdominal aortic aneurysm. No evidence  of leak. 2. Bilateral internal iliac arteries are heavily calcified and occluded. 3. Extensive atherosclerotic disease with calcified and noncalcified plaque. 4. Stable in size indeterminate 1.5 cm hypoattenuated lesion in the upper pole of the left kidney. Further evaluation with renal ultrasound is recommended. 5. Upper lobe predominant emphysematous changes, mild-to-moderate. 6. Lower lobe predominant subpleural interstitial changes. 7. Aortic atherosclerosis.   Aortic Atherosclerosis (ICD10-I70.0) and Emphysema (ICD10-J43.9).   EKG: I have personally reviewed.  Sinus rhythm, QTc 434, very low voltages diffusely   Review of Systems:   General: no fevers, chills, no body weight gain, has fatigue HEENT: no blurry vision, hearing changes or sore throat Respiratory: no dyspnea, coughing, wheezing CV: no chest pain, no palpitations GI: had nausea, vomiting, no abdominal pain, diarrhea, constipation GU: no dysuria, burning on urination, increased urinary frequency, hematuria  Ext: no leg edema Neuro: no unilateral weakness, numbness, or tingling, no vision change or hearing loss. Has syncope Skin: no rash, no skin tear. MSK: No muscle spasm, no deformity, no limitation of range of movement in spin Heme: No easy bruising.  Travel history: No recent long distant travel.   Allergy:  Allergies  Allergen Reactions   Lipitor [Atorvastatin] Other (See Comments)    Confusion    Sulfa Antibiotics Other (See Comments)    Unknown reaction Unknown-- reaction as child     Past Medical History:  Diagnosis Date   Anemia    Aortic atherosclerosis (HCC)    Atherosclerosis of native arteries of extremity with intermittent claudication (HCC)    CAD (coronary artery disease)    Depression    Diastolic dysfunction  a.) TTE 11/18/2021: EF 60-65%; triv MR; G1DD   Heart murmur    History of kidney stones    HLD (hyperlipidemia)    Hypertension    Hypothyroidism    Infrarenal  abdominal aortic aneurysm (AAA) without rupture (HCC)    a.) CTA 09/16/2021: infrarenal AAA with mural thrombus measuring 5.2 x 3.6 cm   Long term current use of antithrombotics/antiplatelets    a.) ASA + cilostazol   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[facility])     Past Surgical History:  Procedure Laterality Date   ABDOMINAL HYSTERECTOMY     ENDOVASCULAR REPAIR/STENT GRAFT N/A 12/04/2021   Procedure: ENDOVASCULAR REPAIR/STENT GRAFT;  Surgeon: Annice Needy, MD;  Location: [facility] INVASIVE CV LAB;  Service: Cardiovascular;  Laterality: N/A;   EYE SURGERY Bilateral    NASAL SINUS SURGERY  1970   shunt in head  2022    Social History:  reports that she has been smoking cigarettes. She has never used smokeless tobacco. She reports current alcohol use of about 1.0 - 2.0 standard drink of alcohol per week. She reports that she does not use drugs.  Family History:  Family History  Adopted: Yes     Prior to Admission medications   Medication Sig Start Date End Date Taking? Authorizing Provider  ascorbic acid (VITAMIN C) 1000 MG tablet Take by mouth.    [provider]  Aspirin 81 MG CAPS Take by mouth.    [provider]  Calcium Citrate-Vitamin D3 1000-0.01 MG/30ML LIQD Take 2 tablets by mouth every morning.    [provider]  cilostazol (PLETAL) 50 MG tablet Take 1 tablet (50 mg total) by mouth 2 (two) times daily. 03/20/23   Margarita Mail, DO  levothyroxine (SYNTHROID) 75 MCG tablet Take 1 tablet (75 mcg total) by mouth daily. 03/20/23   Margarita Mail, DO  lisinopril  (ZESTRIL) 10 MG tablet Take 1 tablet (10 mg total) by mouth daily. 03/20/23   Margarita Mail, DO  lovastatin (ALTOPREV) 60 MG 24 hr tablet Take 1 tablet (60 mg total) by mouth at bedtime. 03/20/23   Margarita Mail, DO  Multiple Vitamin (MULTIVITAMIN) tablet Take 1 tablet by mouth every morning.    [provider]    Physical Exam: Vitals:   08/22/23 2130 08/22/23 2230 08/22/23 2236 08/23/23 0020  BP: 118/78 107/75  (!) 147/85  Pulse: 95 98  91  Resp: 16 17  16   Temp:   98.2 F (36.8 C) 98.5 F (36.9 C)  TempSrc:   Oral   SpO2: 98% 99%  100%  Weight:    59.7 kg  Height:    5' (1.524 m)   General: Not in acute distress HEENT:       Eyes: PERRL, EOMI, no jaundice       ENT: No discharge from the ears and nose, no pharynx injection, no tonsillar enlargement.        Neck: No JVD, no bruit, no mass felt. Heme: No neck lymph node enlargement. Cardiac: S1/S2, RRR, No gallops or rubs. Respiratory: No rales, wheezing, rhonchi or rubs.  GI: Soft, nondistended, nontender, no rebound pain, no organomegaly, BS present. GU: No hematuria Ext: No pitting leg edema bilaterally. 1+DP/PT pulse bilaterally. Musculoskeletal: No joint deformities, No joint redness or warmth, no limitation of ROM in spin. Skin: No rashes.  Neuro: Alert, oriented X3, cranial nerves II-XII grossly intact, moves all extremities normally. Psych: Patient is not psychotic, no suicidal or hemocidal ideation.  Labs on Admission: I have personally reviewed following labs and imaging studies  CBC: Recent Labs  Lab 08/22/23 1846  WBC 9.9  HGB 12.7  HCT 38.7  MCV 93.9  PLT 337   Basic Metabolic Panel: Recent Labs  Lab 08/22/23 1845 08/22/23 1846  NA  --  135  K  --  3.3*  CL  --  104  CO2  --  20*  GLUCOSE  --  144*  BUN  --  16  CREATININE  --  1.03*  CALCIUM  --  8.9  MG 2.2  --   PHOS 3.0  --    GFR: Estimated Creatinine Clearance: 35.8 mL/min (A) (by C-G formula based on SCr of 1.03  mg/dL (H)). Liver Function Tests: Recent Labs  Lab 08/22/23 1846  AST 14*  ALT 11  ALKPHOS 74  BILITOT 0.4  PROT 7.3  ALBUMIN 3.9   Recent Labs  Lab 08/22/23 1846  LIPASE 33   No results for input(s): "AMMONIA" in the last 168 hours. Coagulation Profile: No results for input(s): "INR", "PROTIME" in the last 168 hours. Cardiac Enzymes: No results for input(s): "CKTOTAL", "CKMB", "CKMBINDEX", "TROPONINI" in the last 168 hours. BNP (last 3 results) No results for input(s): "PROBNP" in the last 8760 hours. HbA1C: No results for input(s): "HGBA1C" in the last 72 hours. CBG: No results for input(s): "GLUCAP" in the last 168 hours. Lipid Profile: No results for input(s): "CHOL", "HDL", "LDLCALC", "TRIG", "CHOLHDL", "LDLDIRECT" in the last 72 hours. Thyroid Function Tests: No results for input(s): "TSH", "T4TOTAL", "FREET4", "T3FREE", "THYROIDAB" in the last 72 hours. Anemia Panel: No results for input(s): "VITAMINB12", "FOLATE", "FERRITIN", "TIBC", "IRON", "RETICCTPCT" in the last 72 hours. Urine analysis:    Component Value Date/Time   COLORURINE YELLOW (A) 08/22/2023 2222   APPEARANCEUR HAZY (A) 08/22/2023 2222   LABSPEC 1.021 08/22/2023 2222   PHURINE 6.0 08/22/2023 2222   GLUCOSEU NEGATIVE 08/22/2023 2222   HGBUR MODERATE (A) 08/22/2023 2222   BILIRUBINUR NEGATIVE 08/22/2023 2222   KETONESUR NEGATIVE 08/22/2023 2222   PROTEINUR NEGATIVE 08/22/2023 2222   NITRITE POSITIVE (A) 08/22/2023 2222   LEUKOCYTESUR NEGATIVE 08/22/2023 2222   Sepsis Labs: @LABRCNTIP (procalcitonin:4,lacticidven:4) )No results found for this or any previous visit (from the past 240 hour(s)).   Radiological Exams on Admission: CT Angio Chest/Abd/Pel for Dissection W and/or Wo Contrast  Result Date: 08/22/2023 CLINICAL DATA:  Acute aortic syndrome. EXAM: CT ANGIOGRAPHY CHEST, ABDOMEN AND PELVIS TECHNIQUE: Non-contrast CT of the chest was initially obtained. Multidetector CT imaging through the  chest, abdomen and pelvis was performed using the standard protocol during bolus administration of intravenous contrast. Multiplanar reconstructed images and MIPs were obtained and reviewed to evaluate the vascular anatomy. RADIATION DOSE REDUCTION: This exam was performed according to the departmental dose-optimization program which includes automated exposure control, adjustment of the mA and/or kV according to patient size and/or use of iterative reconstruction technique. CONTRAST:  OMNIPAQUE IOHEXOL 350 MG/ML SOLN COMPARISON:  December 11, 2020, CT of the abdomen. FINDINGS: CTA CHEST FINDINGS Cardiovascular: Preferential opacification of the thoracic aorta. No evidence of thoracic aortic  aneurysm or dissection. Normal heart size. No pericardial effusion. Calcified and noncalcified plaque of the aorta. Tortuosity of the aorta. Mediastinum/Nodes: No enlarged mediastinal, hilar, or axillary lymph nodes. Thyroid gland, trachea, and esophagus demonstrate no significant findings. Lungs/Pleura: Upper lobe predominant emphysematous changes, mild-to-moderate. Lower lobe predominant subpleural interstitial changes. Musculoskeletal: No chest wall abnormality. No acute or significant osseous findings. Review of the MIP images confirms the above findings. CTA ABDOMEN AND PELVIS FINDINGS VASCULAR Aorta: Intact aortobifemoral graft across mild fusiform infrarenal abdominal aortic aneurysm. No evidence of leak. Background of atherosclerotic disease of the aorta with calcified and noncalcified plaque. Celiac: Atherosclerotic calcifications and its origin. Ectasia of its proximal segment noted, measuring 1 cm, stable. SMA: Nonocclusive calcified plaque. Renals: Ostial calcifications at the bilateral renal arteries causing mild stenosis. IMA: Not seen. Inflow: Right common iliac arteries are stented and patent, with the stents extending to the external iliac arteries. 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 Veins: No obvious  venous abnormality within the limitations of this arterial phase study. Review of the MIP images confirms the above findings. NON-VASCULAR Hepatobiliary: No focal liver abnormality is seen. No gallstones, gallbladder wall thickening, or biliary dilatation. Pancreas: Unremarkable. No pancreatic ductal dilatation or surrounding inflammatory changes. Spleen: Normal in size without focal abnormality. Adrenals/Urinary Tract: Adrenal glands are unremarkable. No nephrolithiasis or hydronephrosis. Stable in size indeterminate 1.5 cm hypoattenuated lesion in the upper pole of the left kidney. Bladder is unremarkable. Stomach/Bowel: Stomach is within normal limits. No evidence of bowel wall thickening, distention, or inflammatory changes. Lymphatic: No evidence of abdominal lymphadenopathy. Reproductive: Status post hysterectomy. No adnexal masses. Other: No abdominal wall hernia or abnormality. No abdominopelvic ascites. VP shunt tubing present. Musculoskeletal: Multilevel spondylosis of the spine. Grade 1 anterolisthesis of L4 on L5. Review of the MIP images confirms the above findings. IMPRESSION: 1. Intact aortobifemoral graft across mild fusiform infrarenal abdominal aortic aneurysm. No evidence of leak. 2. Bilateral internal iliac arteries are heavily calcified and occluded. 3. Extensive atherosclerotic disease with calcified and noncalcified plaque. 4. Stable in size indeterminate 1.5 cm hypoattenuated lesion in the upper pole of the left kidney. Further evaluation with renal ultrasound is recommended. 5. Upper lobe predominant emphysematous changes, mild-to-moderate. 6. Lower lobe predominant subpleural interstitial changes. 7. Aortic atherosclerosis. Aortic Atherosclerosis (ICD10-I70.0) and Emphysema (ICD10-J43.9). Electronically Signed   By: Ted Mcalpine M.D.   On: 08/22/2023 21:32   DG Chest Portable 1 View  Result Date: 08/22/2023 CLINICAL DATA:  Seizure-like activity and hypotension EXAM: PORTABLE  CHEST 1 VIEW COMPARISON:  Chest radiograph dated 03/07/2022 FINDINGS: Partially imaged right ventriculoperitoneal shunt catheter. The portion overlying the lower mediastinum is not well seen due to superimposition of the thoracic spine. Visualized portions without kinking or discontinuity. Normal lung volumes. No focal consolidations. No pleural effusion or pneumothorax. The heart size and mediastinal contours are within normal limits. No acute osseous abnormality. IMPRESSION: 1. Visualized portions of the right ventriculoperitoneal shunt catheter without kinking or focal discontinuity. 2. No active disease. Electronically Signed   By: Agustin Cree M.D.   On: 08/22/2023 20:31   CT Head Wo Contrast  Result Date: 08/22/2023 CLINICAL DATA:  Initial evaluation for new onset seizure. EXAM: CT HEAD WITHOUT CONTRAST TECHNIQUE: Contiguous axial images were obtained from the base of the skull through the vertex without intravenous contrast. RADIATION DOSE REDUCTION: This exam was performed according to the departmental dose-optimization program which includes automated exposure control, adjustment of the mA and/or kV according to patient size and/or use of iterative reconstruction  technique. COMPARISON:  Prior study from 03/07/2022. FINDINGS: Brain: Postoperative changes from prior bifrontal craniotomy. Right posterior approach VP shunt catheter in place with tip terminating near the septum pellucidum. Underlying hydrocephalus, relatively stable from prior. Periventricular hypodensity, also similar. No acute intracranial hemorrhage. No acute large vessel territory infarct. No mass lesion or midline shift. Dural thickening measuring up to 2 mm underlying the left-sided craniotomy bone flap noted. No extra-axial fluid collection. Vascular: No abnormal hyperdense vessel. Calcified atherosclerosis present at the skull base. Skull: Scalp soft tissues demonstrate no acute finding. Visualized right posterior approach VP shunt  catheter intact without adverse features. Sinuses/Orbits: Globes and orbital soft tissues within normal limits. Trace layering secretions noted within the visualized right greater than left maxillary sinuses. No mastoid effusion. Other: None. IMPRESSION: 1. No acute intracranial abnormality. 2. Postoperative changes from prior bifrontal craniotomy with right posterior approach VP shunt catheter in place. Underlying hydrocephalus with periventricular hypodensity, relatively stable from prior. Electronically Signed   By: Rise Mu M.D.   On: 08/22/2023 19:33      Assessment/Plan Principal Problem:   Syncope Active Problems:   Hypotension   UTI (urinary tract infection)   Severe sepsis (HCC)   PAD (peripheral artery disease) (HCC)   Hypothyroidism   Chronic diastolic CHF (congestive heart failure) (HCC)   HLD (hyperlipidemia)   Essential hypertension   Hypokalemia   Renal lesion_left   Abdominal aortic aneurysm (AAA) without rupture (HCC)   NPH (normal pressure hydrocephalus) (HCC)   Tobacco abuse   Assessment and Plan:   Syncope: CT of head negative for acute intracranial abnormalities.  No focal neurodeficit on physical examination.  Possibly due to hypotension, however patient has very low voltage on EKG.  Will get 2D echo  -Placed on telemetry bed for observation -Frequent neurocheck -IV fluid -Hold lisinopril -2D echo  Hypotension: Likely due to sepsis secondary to UTI. -Hold lisinopril -IV fluid: Total of 1.5 L normal saline, then 100 cc/h  Severe epsis due to possible UTI (urinary tract infection): Urinalysis showed yellow appearance, negative leukocyte, positive nitrite, rare bacteria, WBC 0-5.  Patient meets criteria for severe sepsis with heart rate of 107, RR 21.  Lactic acid is elevated 2.7.  Patient had hypotension initially which responded to IV fluid resuscitation.  -IV Rocephin -Follow-up blood culture and urine culture  PAD (peripheral artery  disease) (HCC) -Cilostazol  Hypothyroidism -Synthroid  Chronic diastolic CHF (congestive heart failure) (HCC): 2D echo on 11/18/2021 showed EF of 60 to 65% with grade 1 diastolic dysfunction.  Patient does not have leg edema JVD.  CHF is compensated. -Check BNP -Watch volume status closely  HLD (hyperlipidemia) -Switch lovastatin to pravastatin in hospital  Essential hypertension -Hold lisinopril due to hypotension  Hypokalemia: Potassium 3.3.  Magnesium 2.2, phosphorus 3.0 -Repleted potassium  Renal lesion_left: This is incidental finding by CTA -Follow up with PCP as outpatient  Abdominal aortic aneurysm (AAA) without rupture (HCC) -Follow up with PCP as outpatient  NPH (normal pressure hydrocephalus) (HCC) -s/p of VP shunt which is stable  Tobacco abuse -Nicotine patch       DVT ppx: SQ Lovenox  Code Status: Full code     Family Communication: not done, no family member is at bed side.   Disposition Plan:  Anticipate discharge back to previous environment, Senior living facility  Consults called: None  Admission status and Level of care: Telemetry Medical:  for obs    Dispo: The patient is from:  Senior living facility  Anticipated d/c is to: Senior living facility              Anticipated d/c date is: 1 day              Patient currently is not medically stable to d/c.    Severity of Illness:  The appropriate patient status for this patient is OBSERVATION. Observation status is judged to be reasonable and necessary in order to provide the required intensity of service to ensure the patient's safety. The patient's presenting symptoms, physical exam findings, and initial radiographic and laboratory data in the context of their medical condition is felt to place them at decreased risk for further clinical deterioration. Furthermore, it is anticipated that the patient will be medically stable for discharge from the hospital within 2 midnights of  admission.          Date of Service 08/23/2023    Lorretta Harp [facility]   If 7PM-7AM, please contact night-coverage [email or web address] 08/23/2023, 12:51 AM

## 2023-08-23 ENCOUNTER — Observation Stay (HOSPITAL_BASED_OUTPATIENT_CLINIC_OR_DEPARTMENT_OTHER)
Admit: 2023-08-23 | Discharge: 2023-08-23 | Disposition: A | Discharge status: Home / Self Care | Payer: Medicare Other | Attending: Internal Medicine | Admitting: Internal Medicine

## 2023-08-23 ENCOUNTER — Encounter: Payer: Self-pay | Admitting: Internal Medicine

## 2023-08-23 DIAGNOSIS — A419 Sepsis, unspecified organism: Secondary | ICD-10-CM | POA: Diagnosis present

## 2023-08-23 DIAGNOSIS — N390 Urinary tract infection, site not specified: Secondary | ICD-10-CM | POA: Diagnosis present

## 2023-08-23 DIAGNOSIS — R55 Syncope and collapse: Secondary | ICD-10-CM

## 2023-08-23 DIAGNOSIS — F488 Other specified nonpsychotic mental disorders: Secondary | ICD-10-CM | POA: Diagnosis not present

## 2023-08-23 DIAGNOSIS — R6520 Severe sepsis without septic shock: Secondary | ICD-10-CM | POA: Diagnosis present

## 2023-08-23 LAB — CBC
HCT: 34.1 % — ABNORMAL LOW (ref 36.0–46.0)
Hemoglobin: 11.3 g/dL — ABNORMAL LOW (ref 12.0–15.0)
MCH: 31.4 pg (ref 26.0–34.0)
MCHC: 33.1 g/dL (ref 30.0–36.0)
MCV: 94.7 fL (ref 80.0–100.0)
Platelets: 294 10*3/uL (ref 150–400)
RBC: 3.6 MIL/uL — ABNORMAL LOW (ref 3.87–5.11)
RDW: 14.5 % (ref 11.5–15.5)
WBC: 7.7 10*3/uL (ref 4.0–10.5)
nRBC: 0 % (ref 0.0–0.2)

## 2023-08-23 LAB — BASIC METABOLIC PANEL
Anion gap: 6 (ref 5–15)
BUN: 11 mg/dL (ref 8–23)
CO2: 20 mmol/L — ABNORMAL LOW (ref 22–32)
Calcium: 8.1 mg/dL — ABNORMAL LOW (ref 8.9–10.3)
Chloride: 113 mmol/L — ABNORMAL HIGH (ref 98–111)
Creatinine, Ser: 0.81 mg/dL (ref 0.44–1.00)
GFR, Estimated: >60 mL/min (ref >60)
Glucose, Bld: 97 mg/dL (ref 70–99)
Potassium: 4.1 mmol/L (ref 3.5–5.1)
Sodium: 139 mmol/L (ref 135–145)

## 2023-08-23 LAB — ECHOCARDIOGRAM COMPLETE
AR max vel: 2.79 cm2
AV Peak grad: 7.3 mm[Hg]
Ao pk vel: 1.35 m/s
Area-P 1/2: 3.53 cm2
Height: 60 in
S' Lateral: 2.7 cm
Weight: 2107.2 [oz_av]

## 2023-08-23 MED ORDER — ACETAMINOPHEN 325 MG PO TABS: 650.0000 mg | ORAL_TABLET | Freq: Four times a day (QID) | ORAL | Status: DC | PRN

## 2023-08-23 MED ORDER — HYDRALAZINE HCL 20 MG/ML IJ SOLN: 10.0000 mg | INTRAMUSCULAR | Status: DC | PRN

## 2023-08-23 MED ORDER — CEPHALEXIN 250 MG PO CAPS: 250.0000 mg | ORAL_CAPSULE | Freq: Four times a day (QID) | ORAL | 0 refills | Status: AC

## 2023-08-23 MED ORDER — PRAVASTATIN SODIUM 20 MG PO TABS: 60.0000 mg | ORAL_TABLET | Freq: Every day | ORAL | Status: DC

## 2023-08-23 MED ORDER — SENNOSIDES-DOCUSATE SODIUM 8.6-50 MG PO TABS: 1.0000 | ORAL_TABLET | Freq: Every evening | ORAL | Status: DC | PRN

## 2023-08-23 MED ORDER — CILOSTAZOL 100 MG PO TABS
50.0000 mg | ORAL_TABLET | Freq: Two times a day (BID) | ORAL | Status: DC
  Administered 2023-08-23: 50 mg via ORAL
  Filled 2023-08-23 (×2): qty 0.5

## 2023-08-23 MED ORDER — TRAZODONE HCL 50 MG PO TABS: 50.0000 mg | ORAL_TABLET | Freq: Every evening | ORAL | Status: DC | PRN

## 2023-08-23 MED ORDER — IPRATROPIUM-ALBUTEROL 0.5-2.5 (3) MG/3ML IN SOLN: 3.0000 mL | RESPIRATORY_TRACT | Status: DC | PRN

## 2023-08-23 MED ORDER — LEVOTHYROXINE SODIUM 75 MCG PO TABS
75.0000 ug | ORAL_TABLET | Freq: Every day | ORAL | Status: DC
  Administered 2023-08-23: 75 ug via ORAL
  Filled 2023-08-23: qty 3
  Filled 2023-08-23: qty 1

## 2023-08-23 MED ORDER — GUAIFENESIN 100 MG/5ML PO LIQD: 5.0000 mL | ORAL | Status: DC | PRN

## 2023-08-23 MED ORDER — SODIUM CHLORIDE 0.9 % IV SOLN
1.0000 g | INTRAVENOUS | Status: DC
  Administered 2023-08-23: 1 g via INTRAVENOUS
  Filled 2023-08-23: qty 10

## 2023-08-23 MED ORDER — METOPROLOL TARTRATE 5 MG/5ML IV SOLN: 5.0000 mg | INTRAVENOUS | Status: DC | PRN

## 2023-08-23 MED ORDER — LOVASTATIN ER 60 MG PO TB24: 60.0000 mg | ORAL_TABLET | Freq: Every day | ORAL | Status: DC

## 2023-08-23 NOTE — Evaluation (Signed)
Physical Therapy Evaluation Patient Details Name: Tracy Parsons MRN: [id number] DOB: April 18, 1946 Today's Date: 08/23/2023  History of Present Illness  Pt is a 77 y/o F admitted on 08/22/23 after presenting with syncope with conflicting reports of seizure. Pt is being treated for syncope possibly 2/2 hypotension, severe sepsis 2/2 possible UTI. PMH: HTN, HLD, PVD, CAD, diastolic CHF, hypothyroidism, kidney stone, SDH, NPH s/p VP shunt, AAA, tobacco abuse  Clinical Impression  Pt seen for PT evaluation with pt agreeable to tx. Pt reports prior to admission she was ambulatory without AD in the home, uses a rollator to go to the dining hall. On this date, pt is able to complete bed mobility with mod I & extra time, STS with supervision, & ambulate into hallway without AD with CGA with 1 LOB to R. PT educates pt on recommendation to use rollator for increased balance & safety with moblity. Pt eager to go home & slightly irritable during session. Pt denies dizziness/lightheadedness.  BP checked in LUE  BP HR  Supine  138/93 mmHg MAP 107 93 bpm  Sitting 152/94 mmHg MAP 106 96 bpm  Standing at 0 155/94 mmHg MAP 112 104 bpm  Sitting after gait 142/86 mmHg MAP 102 95 bpm           If plan is discharge home, recommend the following: A little help with bathing/dressing/bathroom;A little help with walking and/or transfers;Assist for transportation;Help with stairs or ramp for entrance   Can travel by private vehicle        Equipment Recommendations None recommended by PT (pt has rollator)  Recommendations for Other Services       Functional Status Assessment Patient has had a recent decline in their functional status and demonstrates the ability to make significant improvements in function in a reasonable and predictable amount of time.     Precautions / Restrictions Precautions Precautions: Fall Restrictions Weight Bearing Restrictions: No      Mobility  Bed Mobility Overal bed  mobility: Needs Assistance Bed Mobility: Supine to Sit     Supine to sit: Modified independent (Device/Increase time), HOB elevated, Used rails (extra time to exit L side of bed)          Transfers Overall transfer level: Needs assistance Equipment used: None Transfers: Sit to/from Stand Sit to Stand: Supervision           General transfer comment: STS from EOB    Ambulation/Gait Ambulation/Gait assistance: Contact guard assist Gait Distance (Feet): 85 Feet Assistive device: None Gait Pattern/deviations: Shuffle, Decreased dorsiflexion - right, Decreased dorsiflexion - left, Decreased step length - left, Decreased step length - right, Decreased stride length Gait velocity: decreased     General Gait Details: decreased R hip/knee flexion during swing phase, decreased RLE dorsiflexion, decreased RLE heel strike, 1 LOB to R, pt holds to wall to correct balance  Stairs            Wheelchair Mobility     Tilt Bed    Modified Rankin (Stroke Patients Only)       Balance Overall balance assessment: Needs assistance Sitting-balance support: Feet supported Sitting balance-Leahy Scale: Good     Standing balance support: During functional activity, No upper extremity supported Standing balance-Leahy Scale: Fair                               Pertinent Vitals/Pain Pain Assessment Pain Assessment: No/denies pain    Home Living  Family/patient expects to be discharged to:: Private residence Living Arrangements: Spouse/significant other Available Help at Discharge: Family;Available 24 hours/day Type of Home: Apartment (at [facility] ILF) Home Access: Level entry       Home Layout: One level Home Equipment: Rollator (4 wheels)      Prior Function               Mobility Comments: Pt reports no falls in the past 6 months, lives with her husband in ILF apartment, ambulates without AD in the apartment or holds to furniture, uses rollator to  ambulate to dining hall.       Extremity/Trunk Assessment   Upper Extremity Assessment Upper Extremity Assessment: LUE deficits/detail (pt reports hx of L arm issues since receiving injection last year, decreased use during supine>sit)    Lower Extremity Assessment Lower Extremity Assessment: RLE deficits/detail RLE Deficits / Details: decreased R hip/knee flexion during swing phase, reports it's not uncommon for RLE to "give out" on her, pt with hx of "stents"       Communication   Communication Communication: No apparent difficulties  Cognition Arousal: Alert Behavior During Therapy: WFL for tasks assessed/performed (slightly irritable) Overall Cognitive Status: Within Functional Limits for tasks assessed                                          General Comments      Exercises     Assessment/Plan    PT Assessment Patient needs continued PT services  PT Problem List Decreased strength;Decreased activity tolerance;Decreased balance;Decreased safety awareness;Decreased knowledge of use of DME;Decreased mobility       PT Treatment Interventions DME instruction;Modalities;Gait training;Balance training;Neuromuscular re-education;Functional mobility training;Patient/family education;Therapeutic activities;Therapeutic exercise    PT Goals (Current goals can be found in the Care Plan section)  Acute Rehab PT Goals Patient Stated Goal: go home PT Goal Formulation: With patient Time For Goal Achievement: 09/06/23 Potential to Achieve Goals: Good    Frequency Min 1X/week     Co-evaluation               AM-PAC PT "6 Clicks" Mobility  Outcome Measure Help needed turning from your back to your side while in a flat bed without using bedrails?: None Help needed moving from lying on your back to sitting on the side of a flat bed without using bedrails?: A Little Help needed moving to and from a bed to a chair (including a wheelchair)?: A Little Help  needed standing up from a chair using your arms (e.g., wheelchair or bedside chair)?: A Little Help needed to walk in hospital room?: A Little Help needed climbing 3-5 steps with a railing? : A Little 6 Click Score: 19    End of Session   Activity Tolerance: Patient tolerated treatment well Patient left: in chair (in handoff to OT)   PT Visit Diagnosis: Unsteadiness on feet (R26.81);Muscle weakness (generalized) (M62.81)    Time: 8413-2440 PT Time Calculation (min) (ACUTE ONLY): 12 min   Charges:   PT Evaluation $PT Eval Low Complexity: 1 Low   PT General Charges $$ ACUTE PT VISIT: 1 Visit         Aleda Grana, PT, DPT 08/23/23, 11:41 AM   Sandi Mariscal 08/23/2023, 11:38 AM

## 2023-08-23 NOTE — Sepsis Progress Note (Addendum)
Elink monitoring for sepsis protocol, protocol called at 23:59, Lactic acid x 2 earlier with both under 4, there was 2 SBP < 90, blood cultures drew at 18:53, received 1,500 cc fluid and abx just now ordered, pt's weight 56.7 kg

## 2023-08-23 NOTE — Hospital Course (Addendum)
  Brief Narrative:  77 year old with history of HTN, HLD, PVD, CAD, diastolic CHF, hypothyroidism, renal stone, NPH status post VP shunt, AAA, tobacco abuse, SDH presenting with syncope.  Upon admission noted to be hypotensive requiring IV fluid bolus.  CT head, CTA CAP overall negative for acute pathology.  She started feeling significantly well in the hospital.  If echocardiogram is unremarkable, we will discharge her.   Assessment & Plan:  Principal Problem:   Syncope Active Problems:   Hypotension   UTI (urinary tract infection)   Severe sepsis (HCC)   PAD (peripheral artery disease) (HCC)   Hypothyroidism   Chronic diastolic CHF (congestive heart failure) (HCC)   HLD (hyperlipidemia)   Essential hypertension   Hypokalemia   Renal lesion_left   Abdominal aortic aneurysm (AAA) without rupture (HCC)   NPH (normal pressure hydrocephalus) (HCC)   Tobacco abuse     Syncope with hypotension Etiology is unclear.  CT head is negative.  Likely from hypovolemia versus vasovagal.  EKG showed sinus tachycardia, low voltage.  Currently blood pressure is stable and responded to IV fluids.  She is feeling significantly well.  Will discharge if echocardiogram is unremarkable.   Sepsis due to possible UTI (urinary tract infection)  Sepsis physiology has improved.  Currently on IV Rocephin > Keflex upon discharge   PAD (peripheral artery disease) (HCC) On Pletal   Hypothyroidism Synthroid   Chronic diastolic CHF (congestive heart failure) (HCC) Echo in February 2023-EF 65%, grade 1 DD Stable volume status   HLD (hyperlipidemia) Continue statin   Essential hypertension Will resume home regimen upon discharge   Hypokalemia Replete as needed   Renal lesion_left 1.5 cm Incidental finding on CTA.  Outpatient follow-up with PCP   Abdominal aortic aneurysm (AAA) without rupture (HCC) -Follow up with PCP as outpatient   NPH (normal pressure hydrocephalus) (HCC) -s/p of VP shunt  which is stable   Tobacco abuse -Nicotine patch  DVT prophylaxis: Lovenox Code Status: Full code Family Communication:   Status is: Observation The patient remains OBS appropriate and will d/c before 2 midnights.  DC if echocardiogram is unremarkable    Subjective: Doing well no complaints.  Insisting on going home today.   Examination:  General exam: Appears calm and comfortable  Respiratory system: Clear to auscultation. Respiratory effort normal. Cardiovascular system: S1 & S2 heard, RRR. No JVD, murmurs, rubs, gallops or clicks. No pedal edema. Gastrointestinal system: Abdomen is nondistended, soft and nontender. No organomegaly or masses felt. Normal bowel sounds heard. Central nervous system: Alert and oriented. No focal neurological deficits. Extremities: Symmetric 5 x 5 power. Skin: No rashes, lesions or ulcers Psychiatry: Judgement and insight appear normal. Mood & affect appropriate.

## 2023-08-23 NOTE — Progress Notes (Signed)
CODE SEPSIS - PHARMACY COMMUNICATION  **Broad Spectrum Antibiotics should be administered within 1 hour of Sepsis diagnosis**  Time Code Sepsis Called/Page Received: 0003  Antibiotics Ordered: Ceftriaxone  Time of 1st antibiotic administration: 0055  Otelia Sergeant, PharmD, [facility] 08/23/2023 12:05 AM

## 2023-08-23 NOTE — Plan of Care (Signed)

## 2023-08-23 NOTE — ED Provider Notes (Signed)
Patient's urinalysis concerning for possible UTI.  Patient has been moved to the hospital floor at the time of result review.  I have initiated code sepsis.  Hospitalist has ordered Rocephin, updated Dr. Clyde Lundborg.  Her hypotension has since resolved, but given the persistence of hypotension and elevated lactic patient meets criteria for severe sepsis.  Code sepsis initiated   Sharyn Creamer, MD 08/23/23 0003

## 2023-08-23 NOTE — Evaluation (Signed)
Occupational Therapy Evaluation Patient Details Name: Tracy Parsons MRN: [id number] DOB: 23-Dec-1945 Today's Date: 08/23/2023   History of Present Illness Pt is a 77 y/o F admitted on 08/22/23 after presenting with syncope with conflicting reports of seizure. Pt is being treated for syncope possibly 2/2 hypotension, severe sepsis 2/2 possible UTI. PMH: HTN, HLD, PVD, CAD, diastolic CHF, hypothyroidism, kidney stone, SDH, NPH s/p VP shunt, AAA, tobacco abuse   Clinical Impression   Pt was seen for OT evaluation this date. Prior to hospital admission, pt was indep with ADL and using a rollator for mobility to/from the dining hall. Pt lives with her spouse and denies falls. Pt presents to acute OT demonstrating minimally impaired ADL performance and functional mobility 2/2 decreased activity tolerance and balance (See OT problem list for additional functional deficits). Pt currently requires no direct assist for ADL. Pt educated in strategies to support gradual return to PLOF while minimizing falls risk. Pt verbalized understanding, denied additional concerns and eager to discharge home. Do not anticipate the need for follow up OT services upon acute hospital DC.     If plan is discharge home, recommend the following: Assistance with cooking/housework;Assist for transportation;Help with stairs or ramp for entrance    Functional Status Assessment  Patient has had a recent decline in their functional status and demonstrates the ability to make significant improvements in function in a reasonable and predictable amount of time.  Equipment Recommendations  None recommended by OT    Recommendations for Other Services       Precautions / Restrictions Precautions Precautions: Fall Restrictions Weight Bearing Restrictions: No      Mobility Bed Mobility               General bed mobility comments: NT, seated in recliner at start and end of OT session    Transfers Overall transfer  level: Needs assistance Equipment used: None Transfers: Sit to/from Stand Sit to Stand: Supervision                  Balance Overall balance assessment: Needs assistance Sitting-balance support: Feet supported Sitting balance-Leahy Scale: Good     Standing balance support: During functional activity, No upper extremity supported Standing balance-Leahy Scale: Fair                             ADL either performed or assessed with clinical judgement   ADL Overall ADL's : Modified independent                                             Vision         Perception         Praxis         Pertinent Vitals/Pain Pain Assessment Pain Assessment: No/denies pain     Extremity/Trunk Assessment Upper Extremity Assessment Upper Extremity Assessment: LUE deficits/detail LUE Deficits / Details: pt reports hx of L arm issues since receiving injection last year   Lower Extremity Assessment Lower Extremity Assessment: Defer to PT evaluation;RLE deficits/detail RLE Deficits / Details: decreased R hip/knee flexion during swing phase, reports it's not uncommon for RLE to "give out" on her, pt with hx of "stents"       Communication Communication Communication: No apparent difficulties   Cognition Arousal: Alert Behavior During Therapy: [facility] for tasks assessed/performed  Overall Cognitive Status: Within Functional Limits for tasks assessed                                       General Comments       Exercises Other Exercises Other Exercises: Pt educated in strategies to support gradual return to PLOF while minimizing falls risk   Shoulder Instructions      Home Living Family/patient expects to be discharged to:: Private residence Living Arrangements: Spouse/significant other Available Help at Discharge: Family;Available 24 hours/day Type of Home: Apartment (at [facility] ILF) Home Access: Level entry     Home Layout:  One level     Bathroom Shower/Tub: Tub/shower unit;Walk-in shower         Home Equipment: Rollator (4 wheels)          Prior Functioning/Environment               Mobility Comments: Pt reports no falls in the past 6 months, lives with her husband in ILF apartment, ambulates without AD in the apartment or holds to furniture, uses rollator to ambulate to dining hall. ADLs Comments: indep with ADL, seated shower, does not drive anymore but she uses [place] bus services for appts        OT Problem List: Decreased activity tolerance;Impaired balance (sitting and/or standing)      OT Treatment/Interventions:      OT Goals(Current goals can be found in the care plan section) Acute Rehab OT Goals Patient Stated Goal: go home asap OT Goal Formulation: All assessment and education complete, DC therapy  OT Frequency:      Co-evaluation              AM-PAC OT "6 Clicks" Daily Activity     Outcome Measure Help from another person eating meals?: None Help from another person taking care of personal grooming?: None Help from another person toileting, which includes using toliet, bedpan, or urinal?: None Help from another person bathing (including washing, rinsing, drying)?: None Help from another person to put on and taking off regular upper body clothing?: None Help from another person to put on and taking off regular lower body clothing?: None 6 Click Score: 24   End of Session    Activity Tolerance: Patient tolerated treatment well Patient left: in chair;with call bell/phone within reach;with chair alarm set  OT Visit Diagnosis: Other abnormalities of gait and mobility (R26.89)                Time: 1131-1141 OT Time Calculation (min): 10 min Charges:  OT General Charges $OT Visit: 1 Visit OT Evaluation $OT Eval Low Complexity: 1 Low  Arman Filter., MPH, MS, OTR/L ascom [phone] 08/23/23, 1:30 PM

## 2023-08-23 NOTE — Discharge Summary (Signed)
Physician Discharge Summary  Smt Stidam UJW:[id number] DOB: 07-22-1946 DOA: 08/22/2023  PCP: Margarita Mail, DO  Admit date: 08/22/2023 Discharge date: 08/23/2023  Admitted From: Home Disposition:  Home  Recommendations for Outpatient Follow-up:  Follow up with PCP in 1-2 weeks Please obtain BMP/CBC in one week your next doctors visit.  PO Keflex for 4 more days    Discharge Condition: Stable CODE STATUS: Full Diet recommendation: Cardiac    Brief Narrative:  77 year old with history of HTN, HLD, PVD, CAD, diastolic CHF, hypothyroidism, renal stone, NPH status post VP shunt, AAA, tobacco abuse, SDH presenting with syncope.  Upon admission noted to be hypotensive requiring IV fluid bolus.  CT head, CTA CAP overall negative for acute pathology.  She started feeling significantly well in the hospital.  If echocardiogram is unremarkable, we will discharge her.   Assessment & Plan:  Principal Problem:   Syncope Active Problems:   Hypotension   UTI (urinary tract infection)   Severe sepsis (HCC)   PAD (peripheral artery disease) (HCC)   Hypothyroidism   Chronic diastolic CHF (congestive heart failure) (HCC)   HLD (hyperlipidemia)   Essential hypertension   Hypokalemia   Renal lesion_left   Abdominal aortic aneurysm (AAA) without rupture (HCC)   NPH (normal pressure hydrocephalus) (HCC)   Tobacco abuse     Syncope with hypotension Etiology is unclear.  CT head is negative.  Likely from hypovolemia versus vasovagal.  EKG showed sinus tachycardia, low voltage.  Currently blood pressure is stable and responded to IV fluids.  She is feeling significantly well.  Will discharge if echocardiogram is unremarkable.   Sepsis due to possible UTI (urinary tract infection)  Sepsis physiology has improved.  Currently on IV Rocephin > Keflex upon discharge   PAD (peripheral artery disease) (HCC) On Pletal   Hypothyroidism Synthroid   Chronic diastolic CHF (congestive heart  failure) (HCC) Echo in February 2023-EF 65%, grade 1 DD Stable volume status   HLD (hyperlipidemia) Continue statin   Essential hypertension Will resume home regimen upon discharge   Hypokalemia Replete as needed   Renal lesion_left 1.5 cm Incidental finding on CTA.  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  DC if echocardiogram is unremarkable    Subjective: Doing well no complaints.  Insisting on going home today.   Examination:  General exam: Appears calm and comfortable  Respiratory system: Clear to auscultation. Respiratory effort normal. Cardiovascular system: S1 & S2 heard, RRR. No JVD, murmurs, rubs, gallops or clicks. No pedal edema. Gastrointestinal system: Abdomen is nondistended, soft and nontender. No organomegaly or masses felt. Normal bowel sounds heard. Central nervous system: Alert and oriented. No focal neurological deficits. Extremities: Symmetric 5 x 5 power. Skin: No rashes, lesions or ulcers Psychiatry: Judgement and insight appear normal. Mood & affect appropriate.     Discharge Diagnoses:  Principal Problem:   Syncope Active Problems:   Hypotension   UTI (urinary tract infection)   Severe sepsis (HCC)   PAD (peripheral artery disease) (HCC)   Hypothyroidism   Chronic diastolic CHF (congestive heart failure) (HCC)   HLD (hyperlipidemia)   Essential hypertension   Hypokalemia   Renal lesion_left  Abdominal aortic aneurysm (AAA) without rupture (HCC)   NPH (normal pressure hydrocephalus) (HCC)   Tobacco abuse       Discharge Exam: Vitals:   08/23/23 0825 08/23/23 1227  BP:  (!) 153/80 130/86  Pulse: 88 89  Resp: 18 18  Temp: 97.8 F (36.6 C) 99 F (37.2 C)  SpO2: 99% 99%   Vitals:   08/23/23 0812 08/23/23 0823 08/23/23 0825 08/23/23 1227  BP: (!) 154/78 (!) 154/83 (!) 153/80 130/86  Pulse: 88 100 88 89  Resp:  20 18 18   Temp: 98.4 F (36.9 C) 97.8 F (36.6 C) 97.8 F (36.6 C) 99 F (37.2 C)  TempSrc: Oral     SpO2: 100% 100% 99% 99%  Weight:      Height:         Discharge Instructions   Allergies as of 08/23/2023       Reactions   Lipitor [atorvastatin] Other (See Comments)   Confusion   Sulfa Antibiotics Other (See Comments)   Unknown reaction Unknown-- reaction as child        Medication List     STOP taking these medications    ascorbic acid 1000 MG tablet Commonly known as: VITAMIN C   Aspirin 81 MG Caps       TAKE these medications    Calcium Citrate-Vitamin D3 1000-0.01 MG/30ML Liqd Take 2 tablets by mouth every morning.   cephALEXin 250 MG capsule Commonly known as: KEFLEX Take 1 capsule (250 mg total) by mouth 4 (four) times daily for 4 days.   cilostazol 50 MG tablet Commonly known as: PLETAL Take 1 tablet (50 mg total) by mouth 2 (two) times daily.   levothyroxine 75 MCG tablet Commonly known as: SYNTHROID Take 1 tablet (75 mcg total) by mouth daily.   lisinopril 10 MG tablet Commonly known as: ZESTRIL Take 1 tablet (10 mg total) by mouth daily.   lovastatin 60 MG 24 hr tablet Commonly known as: ALTOPREV Take 1 tablet (60 mg total) by mouth at bedtime.   multivitamin tablet Take 1 tablet by mouth every morning.        Follow-up Information     Margarita Mail, DO Follow up in 1 week(s).   Specialty: Internal Medicine Contact information: [address] [place] [address] [phone]                Allergies  Allergen Reactions   Lipitor [Atorvastatin] Other (See Comments)    Confusion    Sulfa Antibiotics Other (See Comments)    Unknown  reaction Unknown-- reaction as child     You were cared for by a hospitalist during your hospital stay. If you have any questions about your discharge medications or the care you received while you were in the hospital after you are discharged, you can call the unit and asked to speak with the hospitalist on call if the hospitalist that took care of you is not available. Once you are discharged, your primary care physician will handle any further medical issues. Please note that no refills for any discharge medications will be authorized once you are discharged, as it is imperative that you return to your primary care physician (or establish a relationship with a primary care physician if you do not have one) for your aftercare needs so that they can reassess your need for medications and monitor your lab values.  You were cared for by a hospitalist during your hospital stay. If you have any  questions about your discharge medications or the care you received while you were in the hospital after you are discharged, you can call the unit and asked to speak with the hospitalist on call if the hospitalist that took care of you is not available. Once you are discharged, your primary care physician will handle any further medical issues. Please note that NO REFILLS for any discharge medications will be authorized once you are discharged, as it is imperative that you return to your primary care physician (or establish a relationship with a primary care physician if you do not have one) for your aftercare needs so that they can reassess your need for medications and monitor your lab values.  Please request your Prim.MD to go over all Hospital Tests and Procedure/Radiological results at the follow up, please get all Hospital records sent to your Prim MD by signing hospital release before you go home.  Get CBC, CMP, 2 view Chest X ray checked  by Primary MD during your next visit or SNF MD in 5-7 days ( we  routinely change or add medications that can affect your baseline labs and fluid status, therefore we recommend that you get the mentioned basic workup next visit with your PCP, your PCP may decide not to get them or add new tests based on their clinical decision)  On your next visit with your primary care physician please Get Medicines reviewed and adjusted.  If you experience worsening of your admission symptoms, develop shortness of breath, life threatening emergency, suicidal or homicidal thoughts you must seek medical attention immediately by calling 911 or calling your MD immediately  if symptoms less severe.  You Must read complete instructions/literature along with all the possible adverse reactions/side effects for all the Medicines you take and that have been prescribed to you. Take any new Medicines after you have completely understood and accpet all the possible adverse reactions/side effects.   Do not drive, operate heavy machinery, perform activities at heights, swimming or participation in water activities or provide baby sitting services if your were admitted for syncope or siezures until you have seen by Primary MD or a Neurologist and advised to do so again.  Do not drive when taking Pain medications.   Procedures/Studies: ECHOCARDIOGRAM COMPLETE  Result Date: 08/23/2023    ECHOCARDIOGRAM REPORT   Patient Name:   MENA BORGMAN Date of Exam: 08/23/2023 Medical Rec #:  [id number]       Height:       60.0 in Accession #:    [id number]      Weight:       131.7 lb Date of Birth:  Aug 17, 1946        BSA:          1.563 m Patient Age:    77 years        BP:           129/71 mmHg Patient Gender: F               HR:           82 bpm. Exam Location:  [facility] Procedure: 2D Echo Indications:     Syncope R55  History:         Patient has prior history of Echocardiogram examinations, most                  recent 11/18/2021.  Sonographer:     Overton Mam RDCS, FASE Referring Phys:  Wynona Neat NIU  Diagnosing Phys:  Julien Nordmann MD  Sonographer Comments: Technically difficult study due to poor echo windows, suboptimal parasternal window and no subcostal window. Image acquisition challenging due to respiratory motion. IMPRESSIONS  1. Left ventricular ejection fraction, by estimation, is 60 to 65%. The left ventricle has normal function. The left ventricle has no regional wall motion abnormalities. Left ventricular diastolic parameters are consistent with Grade I diastolic dysfunction (impaired relaxation).  2. Right ventricular systolic function is normal. The right ventricular size is normal. Tricuspid regurgitation signal is inadequate for assessing PA pressure.  3. The mitral valve is normal in structure. No evidence of mitral valve regurgitation. No evidence of mitral stenosis.  4. The aortic valve is normal in structure. There is moderate calcification of the aortic valve. Aortic valve regurgitation is not visualized. Aortic valve sclerosis/calcification is present, without any evidence of aortic stenosis.  5. The inferior vena cava is normal in size with greater than 50% respiratory variability, suggesting right atrial pressure of 3 mmHg. FINDINGS  Left Ventricle: Left ventricular ejection fraction, by estimation, is 60 to 65%. The left ventricle has normal function. The left ventricle has no regional wall motion abnormalities. The left ventricular internal cavity size was normal in size. There is  no left ventricular hypertrophy. Left ventricular diastolic parameters are consistent with Grade I diastolic dysfunction (impaired relaxation). Right Ventricle: The right ventricular size is normal. No increase in right ventricular wall thickness. Right ventricular systolic function is normal. Tricuspid regurgitation signal is inadequate for assessing PA pressure. Left Atrium: Left atrial size was normal in size. Right Atrium: Right atrial size was normal in size. Pericardium: There is no evidence of  pericardial effusion. Mitral Valve: The mitral valve is normal in structure. Mild mitral annular calcification. No evidence of mitral valve regurgitation. No evidence of mitral valve stenosis. Tricuspid Valve: The tricuspid valve is normal in structure. Tricuspid valve regurgitation is not demonstrated. No evidence of tricuspid stenosis. Aortic Valve: The aortic valve is normal in structure. There is moderate calcification of the aortic valve. Aortic valve regurgitation is not visualized. Aortic valve sclerosis/calcification is present, without any evidence of aortic stenosis. Aortic valve peak gradient measures 7.3 mmHg. Pulmonic Valve: The pulmonic valve was normal in structure. Pulmonic valve regurgitation is not visualized. No evidence of pulmonic stenosis. Aorta: The aortic root is normal in size and structure. Venous: The inferior vena cava is normal in size with greater than 50% respiratory variability, suggesting right atrial pressure of 3 mmHg. IAS/Shunts: No atrial level shunt detected by color flow Doppler.  LEFT VENTRICLE PLAX 2D LVIDd:         4.00 cm   Diastology LVIDs:         2.70 cm   LV e' medial:    7.94 cm/s LV PW:         1.00 cm   LV E/e' medial:  10.4 LV IVS:        1.10 cm   LV e' lateral:   8.49 cm/s LVOT diam:     2.00 cm   LV E/e' lateral: 9.8 LV SV:         82 LV SV Index:   53 LVOT Area:     3.14 cm  RIGHT VENTRICLE RV S prime:     9.46 cm/s TAPSE (M-mode): 1.9 cm LEFT ATRIUM             Index        RIGHT ATRIUM  Index LA diam:        2.60 cm 1.66 cm/m   RA Area:     9.19 cm LA Vol (A2C):   31.1 ml 19.90 ml/m  RA Volume:   19.40 ml 12.41 ml/m LA Vol (A4C):   15.5 ml 9.92 ml/m LA Biplane Vol: 22.8 ml 14.59 ml/m  AORTIC VALVE                 PULMONIC VALVE AV Area (Vmax): 2.79 cm     RVOT Peak grad: 3 mmHg AV Vmax:        135.00 cm/s AV Peak Grad:   7.3 mmHg LVOT Vmax:      120.00 cm/s LVOT Vmean:     82.000 cm/s LVOT VTI:       0.262 m  AORTA Ao Root diam: 2.90 cm Ao Asc  diam:  2.70 cm MITRAL VALVE MV Area (PHT): 3.53 cm     SHUNTS MV Decel Time: 215 msec     Systemic VTI:  0.26 m MV E velocity: 82.80 cm/s   Systemic Diam: 2.00 cm MV A velocity: 126.00 cm/s MV E/A ratio:  0.66 Julien Nordmann MD Electronically signed by Julien Nordmann MD Signature Date/Time: 08/23/2023/2:24:08 PM    Final    CT Angio Chest/Abd/Pel for Dissection W and/or Wo Contrast  Result Date: 08/22/2023 CLINICAL DATA:  Acute aortic syndrome. EXAM: CT ANGIOGRAPHY CHEST, ABDOMEN AND PELVIS TECHNIQUE: Non-contrast CT of the chest was initially obtained. Multidetector CT imaging through the chest, abdomen and pelvis was performed using the standard protocol during bolus administration of intravenous contrast. Multiplanar reconstructed images and MIPs were obtained and reviewed to evaluate the vascular anatomy. RADIATION DOSE REDUCTION: This exam was performed according to the departmental dose-optimization program which includes automated exposure control, adjustment of the mA and/or kV according to patient size and/or use of iterative reconstruction technique. CONTRAST:  OMNIPAQUE IOHEXOL 350 MG/ML SOLN COMPARISON:  December 11, 2020, CT of the abdomen. FINDINGS: CTA CHEST FINDINGS Cardiovascular: Preferential opacification of the thoracic aorta. No evidence of thoracic aortic aneurysm or dissection. Normal heart size. No pericardial effusion. Calcified and noncalcified plaque of the aorta. Tortuosity of the aorta. Mediastinum/Nodes: No enlarged mediastinal, hilar, or axillary lymph nodes. Thyroid gland, trachea, and esophagus demonstrate no significant findings. Lungs/Pleura: Upper lobe predominant emphysematous changes, mild-to-moderate. Lower lobe predominant subpleural interstitial changes. Musculoskeletal: No chest wall abnormality. No acute or significant osseous findings. Review of the MIP images confirms the above findings. CTA ABDOMEN AND PELVIS FINDINGS VASCULAR Aorta: Intact aortobifemoral graft  across mild fusiform infrarenal abdominal aortic aneurysm. No evidence of leak. Background of atherosclerotic disease of the aorta with calcified and noncalcified plaque. Celiac: Atherosclerotic calcifications and its origin. Ectasia of its proximal segment noted, measuring 1 cm, stable. SMA: Nonocclusive calcified plaque. Renals: Ostial calcifications at the bilateral renal arteries causing mild stenosis. IMA: Not seen. Inflow: Right common iliac arteries are stented and patent, with the stents extending to the external iliac arteries. 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 Veins: No obvious venous abnormality within the limitations of this arterial phase study. Review of the MIP images confirms the above findings. NON-VASCULAR Hepatobiliary: No focal liver abnormality is seen. No gallstones, gallbladder wall thickening, or biliary dilatation. Pancreas: Unremarkable. No pancreatic ductal dilatation or surrounding inflammatory changes. Spleen: Normal in size without focal abnormality. Adrenals/Urinary Tract: Adrenal glands are unremarkable. No nephrolithiasis or hydronephrosis. Stable in size indeterminate 1.5 cm hypoattenuated lesion in the upper pole of the left kidney. Bladder  is unremarkable. Stomach/Bowel: Stomach is within normal limits. No evidence of bowel wall thickening, distention, or inflammatory changes. Lymphatic: No evidence of abdominal lymphadenopathy. Reproductive: Status post hysterectomy. No adnexal masses. Other: No abdominal wall hernia or abnormality. No abdominopelvic ascites. VP shunt tubing present. Musculoskeletal: Multilevel spondylosis of the spine. Grade 1 anterolisthesis of L4 on L5. Review of the MIP images confirms the above findings. IMPRESSION: 1. Intact aortobifemoral graft across mild fusiform infrarenal abdominal aortic aneurysm. No evidence of leak. 2. Bilateral internal iliac arteries are heavily calcified and occluded. 3. Extensive atherosclerotic disease with calcified and  noncalcified plaque. 4. Stable in size indeterminate 1.5 cm hypoattenuated lesion in the upper pole of the left kidney. Further evaluation with renal ultrasound is recommended. 5. Upper lobe predominant emphysematous changes, mild-to-moderate. 6. Lower lobe predominant subpleural interstitial changes. 7. Aortic atherosclerosis. Aortic Atherosclerosis (ICD10-I70.0) and Emphysema (ICD10-J43.9). Electronically Signed   By: Ted Mcalpine M.D.   On: 08/22/2023 21:32   DG Chest Portable 1 View  Result Date: 08/22/2023 CLINICAL DATA:  Seizure-like activity and hypotension EXAM: PORTABLE CHEST 1 VIEW COMPARISON:  Chest radiograph dated 03/07/2022 FINDINGS: Partially imaged right ventriculoperitoneal shunt catheter. The portion overlying the lower mediastinum is not well seen due to superimposition of the thoracic spine. Visualized portions without kinking or discontinuity. Normal lung volumes. No focal consolidations. No pleural effusion or pneumothorax. The heart size and mediastinal contours are within normal limits. No acute osseous abnormality. IMPRESSION: 1. Visualized portions of the right ventriculoperitoneal shunt catheter without kinking or focal discontinuity. 2. No active disease. Electronically Signed   By: Agustin Cree M.D.   On: 08/22/2023 20:31   CT Head Wo Contrast  Result Date: 08/22/2023 CLINICAL DATA:  Initial evaluation for new onset seizure. EXAM: CT HEAD WITHOUT CONTRAST TECHNIQUE: Contiguous axial images were obtained from the base of the skull through the vertex without intravenous contrast. RADIATION DOSE REDUCTION: This exam was performed according to the departmental dose-optimization program which includes automated exposure control, adjustment of the mA and/or kV according to patient size and/or use of iterative reconstruction technique. COMPARISON:  Prior study from 03/07/2022. FINDINGS: Brain: Postoperative changes from prior bifrontal craniotomy. Right posterior approach VP shunt  catheter in place with tip terminating near the septum pellucidum. Underlying hydrocephalus, relatively stable from prior. Periventricular hypodensity, also similar. No acute intracranial hemorrhage. No acute large vessel territory infarct. No mass lesion or midline shift. Dural thickening measuring up to 2 mm underlying the left-sided craniotomy bone flap noted. No extra-axial fluid collection. Vascular: No abnormal hyperdense vessel. Calcified atherosclerosis present at the skull base. Skull: Scalp soft tissues demonstrate no acute finding. Visualized right posterior approach VP shunt catheter intact without adverse features. Sinuses/Orbits: Globes and orbital soft tissues within normal limits. Trace layering secretions noted within the visualized right greater than left maxillary sinuses. No mastoid effusion. Other: None. IMPRESSION: 1. No acute intracranial abnormality. 2. Postoperative changes from prior bifrontal craniotomy with right posterior approach VP shunt catheter in place. Underlying hydrocephalus with periventricular hypodensity, relatively stable from prior. Electronically Signed   By: Rise Mu M.D.   On: 08/22/2023 19:33     The results of significant diagnostics from this hospitalization (including imaging, microbiology, ancillary and laboratory) are listed below for reference.     Microbiology: Recent Results (from the past 240 hour(s))  Blood culture (routine x 2)     Status: None (Preliminary result)   Collection Time: 08/22/23  6:53 PM   Specimen: BLOOD  Result Value Ref Range  Status   Specimen Description BLOOD LEFT ASSIST CONTROL  Final   Special Requests   Final    BOTTLES DRAWN AEROBIC AND ANAEROBIC Blood Culture results may not be optimal due to an excessive volume of blood received in culture bottles   Culture   Final    NO GROWTH < 12 HOURS Performed at [facility], [address] [place], [place] [address]    Report Status PENDING  Incomplete   Blood culture (routine x 2)     Status: None (Preliminary result)   Collection Time: 08/22/23  8:31 PM   Specimen: BLOOD  Result Value Ref Range Status   Specimen Description BLOOD BLOOD RIGHT ARM  Final   Special Requests   Final    BOTTLES DRAWN AEROBIC AND ANAEROBIC Blood Culture adequate volume   Culture   Final    NO GROWTH < 12 HOURS Performed at [facility], [address] [place], [place] [address]    Report Status PENDING  Incomplete     Labs: BNP (last 3 results) Recent Labs    08/22/23 1845  BNP 24.0   Basic Metabolic Panel: Recent Labs  Lab 08/22/23 1845 08/22/23 1846 08/23/23 0434  NA  --  135 139  K  --  3.3* 4.1  CL  --  104 113*  CO2  --  20* 20*  GLUCOSE  --  144* 97  BUN  --  16 11  CREATININE  --  1.03* 0.81  CALCIUM  --  8.9 8.1*  MG 2.2  --   --   PHOS 3.0  --   --    Liver Function Tests: Recent Labs  Lab 08/22/23 1846  AST 14*  ALT 11  ALKPHOS 74  BILITOT 0.4  PROT 7.3  ALBUMIN 3.9   Recent Labs  Lab 08/22/23 1846  LIPASE 33   No results for input(s): "AMMONIA" in the last 168 hours. CBC: Recent Labs  Lab 08/22/23 1846 08/23/23 0434  WBC 9.9 7.7  HGB 12.7 11.3*  HCT 38.7 34.1*  MCV 93.9 94.7  PLT 337 294   Cardiac Enzymes: No results for input(s): "CKTOTAL", "CKMB", "CKMBINDEX", "TROPONINI" in the last 168 hours. BNP: Invalid input(s): "POCBNP" CBG: No results for input(s): "GLUCAP" in the last 168 hours. D-Dimer No results for input(s): "DDIMER" in the last 72 hours. Hgb A1c No results for input(s): "HGBA1C" in the last 72 hours. Lipid Profile No results for input(s): "CHOL", "HDL", "LDLCALC", "TRIG", "CHOLHDL", "LDLDIRECT" in the last 72 hours. Thyroid function studies No results for input(s): "TSH", "T4TOTAL", "T3FREE", "THYROIDAB" in the last 72 hours.  Invalid input(s): "FREET3" Anemia work up No results for input(s): "VITAMINB12", "FOLATE", "FERRITIN", "TIBC", "IRON", "RETICCTPCT" in the last  72 hours. Urinalysis    Component Value Date/Time   COLORURINE YELLOW (A) 08/22/2023 2222   APPEARANCEUR HAZY (A) 08/22/2023 2222   LABSPEC 1.021 08/22/2023 2222   PHURINE 6.0 08/22/2023 2222   GLUCOSEU NEGATIVE 08/22/2023 2222   HGBUR MODERATE (A) 08/22/2023 2222   BILIRUBINUR NEGATIVE 08/22/2023 2222   KETONESUR NEGATIVE 08/22/2023 2222   PROTEINUR NEGATIVE 08/22/2023 2222   NITRITE POSITIVE (A) 08/22/2023 2222   LEUKOCYTESUR NEGATIVE 08/22/2023 2222   Sepsis Labs Recent Labs  Lab 08/22/23 1846 08/23/23 0434  WBC 9.9 7.7   Microbiology Recent Results (from the past 240 hour(s))  Blood culture (routine x 2)     Status: None (Preliminary result)   Collection Time: 08/22/23  6:53 PM  Specimen: BLOOD  Result Value Ref Range Status   Specimen Description BLOOD LEFT ASSIST CONTROL  Final   Special Requests   Final    BOTTLES DRAWN AEROBIC AND ANAEROBIC Blood Culture results may not be optimal due to an excessive volume of blood received in culture bottles   Culture   Final    NO GROWTH < 12 HOURS Performed at [facility], [address], [place], [place] [address]    Report Status PENDING  Incomplete  Blood culture (routine x 2)     Status: None (Preliminary result)   Collection Time: 08/22/23  8:31 PM   Specimen: BLOOD  Result Value Ref Range Status   Specimen Description BLOOD BLOOD RIGHT ARM  Final   Special Requests   Final    BOTTLES DRAWN AEROBIC AND ANAEROBIC Blood Culture adequate volume   Culture   Final    NO GROWTH < 12 HOURS Performed at [facility], [address], [place], [place] [address]    Report Status PENDING  Incomplete     Time coordinating discharge:  I have spent 35 minutes face to face with the patient and on the ward discussing the patients care, assessment, plan and disposition with other care givers. >50% of the time was devoted counseling the patient about the risks and benefits of treatment/Discharge disposition and  coordinating care.   SIGNED:   Miguel Rota, MD  [facility] 08/23/2023, 3:08 PM   If 7PM-7AM, please contact night-coverage

## 2023-08-23 NOTE — Progress Notes (Signed)
  Echocardiogram 2D Echocardiogram has been performed.  Lenor Coffin 08/23/2023, 11:20 AM

## 2023-08-24 ENCOUNTER — Telehealth: Payer: Self-pay

## 2023-08-24 NOTE — Transitions of Care (Post Inpatient/ED Visit) (Unsigned)
   08/24/2023  Name: Tracy Parsons MRN: [id number] DOB: 17-Jan-1946  Today's TOC FU Call Status: Today's TOC FU Call Status:: Unsuccessful Call (1st Attempt) Unsuccessful Call (1st Attempt) Date: 08/24/23  Attempted to reach the patient regarding the most recent Inpatient/ED visit.  Follow Up Plan: Additional outreach attempts will be made to reach the patient to complete the Transitions of Care (Post Inpatient/ED visit) call.   Signature Karena Addison, LPN [facility] Nurse Health Advisor Direct Dial [phone]

## 2023-08-24 NOTE — Telephone Encounter (Unsigned)
Copied from CRM [id number]. Topic: Appointment Scheduling - Scheduling Inquiry for Clinic >> Aug 24, 2023  9:48 AM Franchot Heidelberg wrote: Reason for CRM: Pt needs a hosp fu, wants to know if PCP is fine with waiting until her scheduled time in December? Please advise   Best contact: [phone]

## 2023-08-25 NOTE — Progress Notes (Unsigned)
Established Patient Office Visit  Subjective:  Patient ID: Tracy Parsons, female    DOB: January 16, 1946  Age: 77 y.o. MRN: [id number]  CC:  No chief complaint on file.   HPI Tracy Parsons presents for hospital follow up.   Discharge Date: 08/23/23 Diagnosis: Syncope with hypotension Procedures/tests: CT head negative, EKG with sinus tachycardia. Echo with EF 60-65% and grade 1 diastolic dysfunction. Labs - hgb 11.3, calcium 8.1. Urine culture positive for e.coli Consultants: none New medications: discharged on Keflex for 5 days - BP responded well to IV fluids  Discontinued medications: None Discharge instructions:  Follow up with PCP Status: {Blank multiple:19196::"better","worse","stable","fluctuating"}   Hypertension: -Medications: Lisinopril 20 mg -Patient is compliant with above medications and reports no side effects. -Checking BP at home (average): not checking currently  -Denies any SOB, CP, vision changes, LE edema or symptoms of hypotension  AAA/PAD: -CTA 12/22 showing AAA measuring 5.2 x 3.6 cm, recheck in 3/24 diameter 3.6 cm -Underwent open endovascular repair with multiple stents placed 12/04/21 at [facility], recovering well.  -Following with vascular surgery, last seen 12/23/22 -Currently on Cilostazol, had been taking aspirin previously but stopped recently  -Working with physical therapy short walks, exercise bike and doing well   HLD/CAD/PAD: -Medications: Lovastatin 40 mg -Patient is compliant with above medications and reports no side effects.  -Last lipid panel: Lipid Panel     Component Value Date/Time   CHOL 231 (H) 09/25/2022 1003   TRIG 117 09/25/2022 1003   HDL 63 09/25/2022 1003   CHOLHDL 3.7 09/25/2022 1003   LDLCALC 145 (H) 09/25/2022 1003   The 10-year ASCVD risk score (Arnett DK, et al., 2019) is: 40.9%   Values used to calculate the score:     Age: 81 years     Sex: Female     Is Non-Hispanic African American: No     Diabetic:  No     Tobacco smoker: Yes     Systolic Blood Pressure: 130 mmHg     Is BP treated: Yes     HDL Cholesterol: 63 mg/dL     Total Cholesterol: 231 mg/dL  Hypothyroidism: -Medications: Levothyroxine 75 mcg, been on this dose for years -Patient is compliant with the above medication (s) at the above dose and reports no medication side effects.  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 Small chronic subdural hematoma overlying left cerebral hemisphere measuring 0.6 cm which is decreased in size.  -Was seen in the ER 03/07/22 for headache. Had 2 head CT's, first showed moderate hydrocephalus despite ventriculostomy catheter in place. Trace 2 mm thick left cerebral convexity subdural thickening vs. Subdural hematoma, possibly chronic with patchy periventricular white matter hypo-densities nonspecific possible representing periventricular edema vs chronic microvascular disease. Second a few hours later showed unchanged thin left cerebral convexity subdural thickening vs minimal subdural hematoma without associated mass effect. Stable hydrocephalus with ventriculostomy catheter in place.  -Seen by Neurosurgery on 02/27/22  MDD: -Mood status: stable -Current treatment: Zoloft tapered down from 200 mg, had been off the medication for several weeks and had been doing well  -Satisfied with current treatment?: no -  Symptom severity: mild  -Duration of current treatment : years Medication compliance: excellent compliance     03/20/2023    1:02 PM 06/26/2022   10:56 AM 06/26/2022   10:53  AM 03/18/2022    1:06 PM 02/17/2022    3:03 PM  Depression screen PHQ 2/9  Decreased Interest 0 0 0 0 3  Down, Depressed, Hopeless 0 0 0 0 2  PHQ - 2 Score 0 0 0 0 5  Altered sleeping 0   0 1  Tired, decreased energy 0   0 1  Change in appetite 1   0 1  Feeling bad or failure about yourself  0   0 0  Trouble concentrating 0   0 1  Moving slowly or fidgety/restless 0   0 1  Suicidal thoughts 0   0 0  PHQ-9 Score 1   0 10  Difficult doing work/chores Not difficult at all   Not difficult at all Somewhat difficult   Health Maintenance: -Blood work UTD -Due to patient request, all screenings such as mammograms, DEXA, colonoscopies will be discontinued   Past Medical History:  Diagnosis Date   Anemia    Aortic atherosclerosis (HCC)    Atherosclerosis of native arteries of extremity with intermittent claudication (HCC)    CAD (coronary artery disease)    Depression    Diastolic dysfunction    a.) TTE 11/18/2021: EF 60-65%; triv MR; G1DD   Heart murmur    History of kidney stones    HLD (hyperlipidemia)    Hypertension    Hypothyroidism    Infrarenal abdominal aortic aneurysm (AAA) without rupture (HCC)    a.) CTA 09/16/2021: infrarenal AAA with mural thrombus measuring 5.2 x 3.6 cm   Long term current use of antithrombotics/antiplatelets    a.) ASA + cilostazol   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[facility])     Past Surgical History:  Procedure Laterality Date   ABDOMINAL HYSTERECTOMY     ENDOVASCULAR REPAIR/STENT GRAFT N/A 12/04/2021   Procedure: ENDOVASCULAR REPAIR/STENT GRAFT;  Surgeon:  Annice Needy, MD;  Location: [facility] INVASIVE CV LAB;  Service: Cardiovascular;  Laterality: N/A;   EYE SURGERY Bilateral    NASAL SINUS SURGERY  1970   shunt in head  2022    Family History  Adopted: Yes    Social History   Socioeconomic History   Marital status: Married    Spouse name: Not on file   Number of children: Not on file   Years of education: Not on file   Highest education level: Not on file  Occupational History   Not on file  Tobacco Use   Smoking status: Every Day    Current packs/day: 0.25    Types: Cigarettes   Smokeless tobacco: Never  Vaping Use   Vaping status: Never Used  Substance and Sexual Activity   Alcohol use: Yes    Alcohol/week: 1.0 - 2.0 standard drink of alcohol    Types: 1 - 2 Glasses of wine per week   Drug use: Never   Sexual activity: Not Currently  Other Topics Concern   Not on file  Social History Narrative   Not on file   Social Determinants of Health   Financial Resource Strain: Low Risk  (06/26/2022)   Overall Financial Resource Strain (CARDIA)    Difficulty of Paying Living Expenses: Not hard at all  Food Insecurity: No Food Insecurity (08/22/2023)   Hunger Vital Sign    Worried About Running Out of Food in the Last Year: Never true    Ran Out of Food in the Last Year: Never true  Transportation Needs: No Transportation Needs (08/22/2023)   PRAPARE - Administrator, Civil Service (Medical): No    Lack of Transportation (Non-Medical): No  Physical Activity: Inactive (06/26/2022)   Exercise Vital Sign    Days of Exercise per Week: 0 days    Minutes of Exercise per Session: 0 min  Stress: No Stress Concern Present (06/26/2022)   [employer] of Occupational Health - Occupational Stress Questionnaire    Feeling of Stress : Not at all  Social Connections: Moderately Isolated (06/26/2022)   Social Connection and Isolation Panel [NHANES]    Frequency of Communication with Friends and Family: More than three times  a week    Frequency of Social Gatherings with Friends and Family: More than three times a week    Attends Religious Services: Never    Database administrator or Organizations: No    Attends Banker Meetings: Never    Marital Status: Married  Catering manager Violence: Not At Risk (08/22/2023)   Humiliation, Afraid, Rape, and Kick questionnaire    Fear of Current or Ex-Partner: No    Emotionally Abused: No    Physically Abused: No    Sexually Abused: No    ROS Review of Systems  Constitutional:  Negative for chills and fever.  Eyes:  Negative for visual disturbance.  Respiratory:  Negative for cough.   Cardiovascular:  Negative for chest pain.  Gastrointestinal:  Negative for abdominal pain.  Neurological:  Negative for dizziness.    Objective:   Today's Vitals: There were no vitals taken for this visit.  Physical Exam Constitutional:      Appearance: Normal appearance.     Comments: Presents with walker  HENT:     Head: Normocephalic and atraumatic.  Eyes:     Conjunctiva/sclera: Conjunctivae normal.  Cardiovascular:     Rate and Rhythm: Normal rate and regular rhythm.  Pulmonary:     Effort: Pulmonary effort is normal.     Breath sounds: Normal breath sounds.  Skin:    General: Skin is warm and dry.  Neurological:     General: No focal deficit present.     Mental Status: She is alert. Mental status is at baseline.  Psychiatric:        Mood and Affect: Mood normal.        Behavior: Behavior normal.     Assessment & Plan:   1. Essential hypertension: Blood pressure on the lower side today, patient is asymptomatic.  Patient does not monitor her blood pressure at home.  Will decrease lisinopril to 10 mg daily.  Patient lives in a retirement home where they do blood pressure checks every couple weeks, she will attend these to have her blood pressure checked and let me know if it is elevated.  - lisinopril (ZESTRIL) 10 MG tablet; Take 1 tablet (10 mg  total) by mouth daily.  Dispense: 90 tablet; Refill: 3  2. Pure hypercholesterolemia: Reviewed  cholesterol panel with the patient, LDL and ASCVD risk elevated despite being on lovastatin 40 mg daily.  Patient had been on Lipitor previously but could not tolerate.  Willing to try increased dose of lovastatin to 60 mg daily.  Plan to recheck fasting labs at follow-up.  - lovastatin (ALTOPREV) 60 MG 24 hr tablet; Take 1 tablet (60 mg total) by mouth at bedtime.  Dispense: 90 tablet; Refill: 1  3. PAD (peripheral artery disease) [facility]): Doing well, following with vascular surgery, note reviewed from 12/23/2022.  Continue Cilostazol, refilled.  - cilostazol (PLETAL) 50 MG tablet; Take 1 tablet (50 mg total) by mouth 2 (two) times daily.  Dispense: 180 tablet; Refill: 1  4. Hypothyroidism, unspecified type: Refill levothyroxine 75 mcg daily, recheck labs at follow-up.  - levothyroxine (SYNTHROID) 75 MCG tablet; Take 1 tablet (75 mcg total) by mouth daily.  Dispense: 90 tablet; Refill: 1   Follow-up: No follow-ups on file.   Margarita Mail, DO

## 2023-08-25 NOTE — Telephone Encounter (Signed)
She is already scheduled for Nov 21st 2024

## 2023-08-26 LAB — URINE CULTURE: Culture: >=100000 — AB

## 2023-08-27 ENCOUNTER — Encounter: Payer: Self-pay | Admitting: Internal Medicine

## 2023-08-27 ENCOUNTER — Ambulatory Visit (INDEPENDENT_AMBULATORY_CARE_PROVIDER_SITE_OTHER): Payer: Medicare Other | Admitting: Internal Medicine

## 2023-08-27 VITALS — BP 124/70 | HR 87 | Temp 98.2°F | Resp 18 | Ht 60.0 in | Wt 125.3 lb

## 2023-08-27 DIAGNOSIS — N3000 Acute cystitis without hematuria: Secondary | ICD-10-CM

## 2023-08-27 DIAGNOSIS — F330 Major depressive disorder, recurrent, mild: Secondary | ICD-10-CM | POA: Diagnosis not present

## 2023-08-27 DIAGNOSIS — Z09 Encounter for follow-up examination after completed treatment for conditions other than malignant neoplasm: Secondary | ICD-10-CM

## 2023-08-27 DIAGNOSIS — N289 Disorder of kidney and ureter, unspecified: Secondary | ICD-10-CM

## 2023-08-27 LAB — POCT URINALYSIS DIPSTICK
Bilirubin, UA: NEGATIVE
Glucose, UA: NEGATIVE
Ketones, UA: NEGATIVE
Leukocytes, UA: NEGATIVE
Nitrite, UA: NEGATIVE
Protein, UA: NEGATIVE
Spec Grav, UA: 1.025 (ref 1.010–1.025)
Urobilinogen, UA: 0.2 U/dL
pH, UA: 6.5 (ref 5.0–8.0)

## 2023-08-27 LAB — CULTURE, BLOOD (ROUTINE X 2)
Culture: NO GROWTH
Culture: NO GROWTH
Special Requests: ADEQUATE

## 2023-08-27 MED ORDER — SERTRALINE HCL 25 MG PO TABS: 25.0000 mg | ORAL_TABLET | Freq: Every day | ORAL | 0 refills | Status: DC

## 2023-08-28 LAB — URINE CULTURE
MICRO NUMBER:: 15763907
Result:: NO GROWTH
SPECIMEN QUALITY:: ADEQUATE

## 2023-09-02 ENCOUNTER — Ambulatory Visit
Admission: RE | Admit: 2023-09-02 | Discharge: 2023-09-02 | Disposition: A | Discharge status: Home / Self Care | Payer: Medicare Other | Source: Ambulatory Visit | Attending: Internal Medicine | Admitting: Internal Medicine

## 2023-09-02 DIAGNOSIS — Z09 Encounter for follow-up examination after completed treatment for conditions other than malignant neoplasm: Secondary | ICD-10-CM | POA: Diagnosis present

## 2023-09-02 DIAGNOSIS — N281 Cyst of kidney, acquired: Secondary | ICD-10-CM | POA: Insufficient documentation

## 2023-09-02 DIAGNOSIS — N289 Disorder of kidney and ureter, unspecified: Secondary | ICD-10-CM | POA: Diagnosis present

## 2023-09-16 ENCOUNTER — Other Ambulatory Visit: Payer: Self-pay | Admitting: Internal Medicine

## 2023-09-16 DIAGNOSIS — I739 Peripheral vascular disease, unspecified: Secondary | ICD-10-CM

## 2023-09-17 NOTE — Telephone Encounter (Signed)
Requested Prescriptions  Pending Prescriptions Disp Refills   cilostazol (PLETAL) 50 MG tablet [Pharmacy Med Name: CILOSTAZOL 50 MG TAB] 180 tablet 1    Sig: TAKE ONE (1) TABLET BY MOUTH TWO TIMES PER DAY     Hematology: Antiplatelets - cilostazol Passed - 09/17/2023 10:02 AM      Passed - PLT in normal range and within 360 days    Platelets  Date Value Ref Range Status  08/23/2023 294 150 - 400 K/uL Final         Passed - WBC in normal range and within 360 days    WBC  Date Value Ref Range Status  08/23/2023 7.7 4.0 - 10.5 K/uL Final         Passed - Valid encounter within last 6 months    Recent Outpatient Visits           3 weeks ago Hospital discharge follow-up   [facility] Margarita Mail, DO   6 months ago Essential hypertension   [facility] Margarita Mail, DO   12 months ago Essential hypertension   [facility] Margarita Mail, DO   1 year ago Essential hypertension   [facility] Margarita Mail, DO   1 year ago Essential hypertension   [facility] Margarita Mail, DO       Future Appointments             In 6 days Margarita Mail, DO [facility], [facility]

## 2023-09-22 NOTE — Progress Notes (Unsigned)
Established Patient Office Visit  Subjective   Patient ID: Tracy Parsons, female    DOB: 02/14/1946  Age: 77 y.o. MRN: [id number]  No chief complaint on file.   HPI  Patient is here for follow up on chronic medical conditions.   Hypertension: -Medications: Lisinopril 20 mg -Patient is compliant with above medications and reports no side effects. -Checking BP at home (average): not checking currently  -Denies any SOB, CP, vision changes, LE edema or symptoms of hypotension  AAA/PAD: -CTA 12/22 showing AAA measuring 5.2 x 3.6 cm, recheck in 3/24 diameter 3.6 cm -Underwent open endovascular repair with multiple stents placed 12/04/21 at [facility], recovering well.  -Following with vascular surgery, last seen 12/23/22 -Currently on Cilostazol, had been taking aspirin previously but stopped recently  -Working with physical therapy short walks, exercise bike and doing well   HLD/CAD/PAD: -Medications: Lovastatin 40 mg -Patient is compliant with above medications and reports no side effects.  -Last lipid panel: Lipid Panel     Component Value Date/Time   CHOL 231 (H) 09/25/2022 1003   TRIG 117 09/25/2022 1003   HDL 63 09/25/2022 1003   CHOLHDL 3.7 09/25/2022 1003   LDLCALC 145 (H) 09/25/2022 1003   The 10-year ASCVD risk score (Arnett DK, et al., 2019) is: 38%   Values used to calculate the score:     Age: 21 years     Sex: Female     Is Non-Hispanic African American: No     Diabetic: No     Tobacco smoker: Yes     Systolic Blood Pressure: 124 mmHg     Is BP treated: Yes     HDL Cholesterol: 63 mg/dL     Total Cholesterol: 231 mg/dL  Hypothyroidism: -Medications: Levothyroxine 75 mcg, been on this dose for years -Patient is compliant with the above medication (s) at the above dose and reports no medication side effects.  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 Small chronic subdural hematoma overlying left cerebral hemisphere measuring 0.6 cm which is decreased in size.  -Was seen in the ER 03/07/22 for headache. Had 2 head CT's, first showed moderate hydrocephalus despite ventriculostomy catheter in place. Trace 2 mm thick left cerebral convexity subdural thickening vs. Subdural hematoma, possibly chronic with patchy periventricular white matter hypo-densities nonspecific possible representing periventricular edema vs chronic microvascular disease. Second a few hours later showed unchanged thin left cerebral convexity subdural thickening vs minimal subdural hematoma without associated mass effect. Stable hydrocephalus with ventriculostomy catheter in place.  -Seen by Neurosurgery on 02/27/22  MDD: -Mood status: stable -Current treatment: Zoloft tapered down from 200 mg, had been off the medication for several weeks and had been doing well  -Satisfied with current treatment?: no -Symptom severity: mild  -Duration of current treatment : years Medication compliance: excellent compliance     08/27/2023   11:36 AM 03/20/2023    1:02 PM 06/26/2022   10:56 AM 06/26/2022   10:53 AM 03/18/2022    1:06 PM  Depression screen PHQ 2/9  Decreased Interest 0 0 0 0 0  Down, Depressed, Hopeless 0 0 0 0  0  PHQ - 2 Score 0 0 0 0 0  Altered sleeping 0 0   0  Tired, decreased energy 0 0   0  Change in appetite 0 1   0  Feeling bad or failure about yourself  0 0   0  Trouble concentrating 0 0   0  Moving slowly or fidgety/restless 0 0   0  Suicidal thoughts 0 0   0  PHQ-9 Score 0 1   0  Difficult doing work/chores Not difficult at all Not  difficult at all   Not difficult at all     Health Maintenance: -Blood work UTD -Due to patient request, all screenings such as mammograms, DEXA, colonoscopies will be discontinued   {History (Optional):23778}  ROS    Objective:     There were no vitals taken for this visit. {Vitals History (Optional):23777}  Physical Exam   No results found for any visits on 09/23/23.  {Labs (Optional):23779}  The 10-year ASCVD risk score (Arnett DK, et al., 2019) is: 38%    Assessment & Plan:  There are no diagnoses linked to this encounter.   No follow-ups on file.    Margarita Mail, DO

## 2023-09-23 ENCOUNTER — Other Ambulatory Visit: Payer: Self-pay

## 2023-09-23 ENCOUNTER — Telehealth: Payer: Self-pay | Admitting: Internal Medicine

## 2023-09-23 ENCOUNTER — Ambulatory Visit (INDEPENDENT_AMBULATORY_CARE_PROVIDER_SITE_OTHER): Payer: Medicare Other | Admitting: Internal Medicine

## 2023-09-23 VITALS — BP 122/76 | HR 72 | Temp 98.0°F | Resp 14 | Ht 60.0 in | Wt 123.2 lb

## 2023-09-23 DIAGNOSIS — F330 Major depressive disorder, recurrent, mild: Secondary | ICD-10-CM | POA: Diagnosis not present

## 2023-09-23 DIAGNOSIS — E7800 Pure hypercholesterolemia, unspecified: Secondary | ICD-10-CM

## 2023-09-23 DIAGNOSIS — I739 Peripheral vascular disease, unspecified: Secondary | ICD-10-CM

## 2023-09-23 DIAGNOSIS — E039 Hypothyroidism, unspecified: Secondary | ICD-10-CM

## 2023-09-23 DIAGNOSIS — I10 Essential (primary) hypertension: Secondary | ICD-10-CM | POA: Diagnosis not present

## 2023-09-23 DIAGNOSIS — I70221 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, right leg: Secondary | ICD-10-CM | POA: Diagnosis not present

## 2023-09-23 DIAGNOSIS — E559 Vitamin D deficiency, unspecified: Secondary | ICD-10-CM

## 2023-09-23 MED ORDER — SERTRALINE HCL 25 MG PO TABS: 50.0000 mg | ORAL_TABLET | Freq: Every day | ORAL | 1 refills | Status: DC

## 2023-09-23 MED ORDER — LOVASTATIN ER 60 MG PO TB24: 60.0000 mg | ORAL_TABLET | Freq: Every day | ORAL | 1 refills | Status: DC

## 2023-09-23 NOTE — Assessment & Plan Note (Addendum)
 Blood pressure stable here today, no changes made to medications and appropriate refills sent to pharmacy. Labs due.

## 2023-09-23 NOTE — Assessment & Plan Note (Signed)
Check fasting lipid panel today, on statin.

## 2023-09-23 NOTE — Assessment & Plan Note (Signed)
On statin and Cilostazol, following with Vascular.

## 2023-09-23 NOTE — Telephone Encounter (Signed)
Marina from [facility] called stated they do not have the lovastatin (ALTOPREV) 60 MG 24 hr tablet in stock and would like to know if provider wants to xfer the script to another pharmacy or change to an alternate medication. Please f/u with pharmacy

## 2023-09-23 NOTE — Assessment & Plan Note (Signed)
Following with vascular 

## 2023-09-23 NOTE — Assessment & Plan Note (Signed)
Patient thinks Zoloft 25 mg dose is doing well however husband thinks it should be increased. Will write prescription for 25 mg 2 tablets so patient can change dose easier.

## 2023-09-23 NOTE — Assessment & Plan Note (Signed)
Check TSH today, currently on Levothyroxine 75 mcg, will refill after labs return.

## 2023-09-24 LAB — COMPLETE METABOLIC PANEL WITH GFR
AG Ratio: 1.5 (calc) (ref 1.0–2.5)
ALT: 10 U/L (ref 6–29)
AST: 12 U/L (ref 10–35)
Albumin: 4.2 g/dL (ref 3.6–5.1)
Alkaline phosphatase (APISO): 86 U/L (ref 37–153)
BUN: 21 mg/dL (ref 7–25)
CO2: 25 mmol/L (ref 20–32)
Calcium: 10 mg/dL (ref 8.6–10.4)
Chloride: 105 mmol/L (ref 98–110)
Creat: 0.95 mg/dL (ref 0.60–1.00)
Globulin: 2.8 g/dL (ref 1.9–3.7)
Glucose, Bld: 93 mg/dL (ref 65–99)
Potassium: 4.9 mmol/L (ref 3.5–5.3)
Sodium: 138 mmol/L (ref 135–146)
Total Bilirubin: 0.3 mg/dL (ref 0.2–1.2)
Total Protein: 7 g/dL (ref 6.1–8.1)
eGFR: 61 mL/min/{1.73_m2} (ref >=60)

## 2023-09-24 LAB — VITAMIN D 25 HYDROXY (VIT D DEFICIENCY, FRACTURES): Vit D, 25-Hydroxy: 26 ng/mL — ABNORMAL LOW (ref 30–100)

## 2023-09-24 LAB — CBC WITH DIFFERENTIAL/PLATELET
Absolute Lymphocytes: 2003 {cells}/uL (ref 850–3900)
Absolute Monocytes: 704 {cells}/uL (ref 200–950)
Basophils Absolute: 40 {cells}/uL (ref 0–200)
Basophils Relative: 0.6 %
Eosinophils Absolute: 181 {cells}/uL (ref 15–500)
Eosinophils Relative: 2.7 %
HCT: 40.7 % (ref 35.0–45.0)
Hemoglobin: 13.4 g/dL (ref 11.7–15.5)
MCH: 30.7 pg (ref 27.0–33.0)
MCHC: 32.9 g/dL (ref 32.0–36.0)
MCV: 93.1 fL (ref 80.0–100.0)
MPV: 9.9 fL (ref 7.5–12.5)
Monocytes Relative: 10.5 %
Neutro Abs: 3772 {cells}/uL (ref 1500–7800)
Neutrophils Relative %: 56.3 %
Platelets: 343 10*3/uL (ref 140–400)
RBC: 4.37 10*6/uL (ref 3.80–5.10)
RDW: 13.4 % (ref 11.0–15.0)
Total Lymphocyte: 29.9 %
WBC: 6.7 10*3/uL (ref 3.8–10.8)

## 2023-09-24 LAB — TSH: TSH: 0.84 m[IU]/L (ref 0.40–4.50)

## 2023-09-24 LAB — LIPID PANEL
Cholesterol: 206 mg/dL — ABNORMAL HIGH (ref <200)
HDL: 61 mg/dL (ref >=50)
LDL Cholesterol (Calc): 123 mg/dL — ABNORMAL HIGH
Non-HDL Cholesterol (Calc): 145 mg/dL — ABNORMAL HIGH (ref <130)
Total CHOL/HDL Ratio: 3.4 (calc) (ref <5.0)
Triglycerides: 111 mg/dL (ref <150)

## 2023-09-25 ENCOUNTER — Telehealth: Payer: Self-pay

## 2023-09-25 MED ORDER — VITAMIN D (ERGOCALCIFEROL) 1.25 MG (50000 UNIT) PO CAPS: 50000.0000 [IU] | ORAL_CAPSULE | ORAL | 0 refills | Status: AC

## 2023-09-25 MED ORDER — LOVASTATIN 40 MG PO TABS: 80.0000 mg | ORAL_TABLET | Freq: Every day | ORAL | 1 refills | Status: DC

## 2023-09-25 MED ORDER — LEVOTHYROXINE SODIUM 75 MCG PO TABS: 75.0000 ug | ORAL_TABLET | Freq: Every day | ORAL | 1 refills | Status: DC

## 2023-09-25 NOTE — Telephone Encounter (Signed)
Pt given lab results per notes of Dr. Caralee Ates on 09/25/23. Pt verbalized understanding.   Margarita Mail, DO 09/25/2023  7:59 AM EST Back to Top    Cholesterol improved but still high, plan to increase cholesterol medication (pharmacy doesn't have  current dose anyway). Vitamin D levels low.  I am sending a prescription strength supplement to the pharmacy to take once a week for 12 weeks.  Once this is finished, I recommend starting an over-the-counter vitamin D supplement at least at 1000 IU daily. Anemia has improved. No abnormalities noted with kidneys, liver or electrolytes. Thyroid function normal.

## 2023-09-25 NOTE — Addendum Note (Signed)
Addended by: Margarita Mail on: 09/25/2023 07:59 AM   Modules accepted: Orders

## 2023-12-28 ENCOUNTER — Other Ambulatory Visit (INDEPENDENT_AMBULATORY_CARE_PROVIDER_SITE_OTHER): Payer: Self-pay | Admitting: Vascular Surgery

## 2023-12-28 DIAGNOSIS — I70211 Atherosclerosis of native arteries of extremities with intermittent claudication, right leg: Secondary | ICD-10-CM

## 2023-12-28 DIAGNOSIS — I714 Abdominal aortic aneurysm, without rupture, unspecified: Secondary | ICD-10-CM

## 2023-12-29 ENCOUNTER — Ambulatory Visit (INDEPENDENT_AMBULATORY_CARE_PROVIDER_SITE_OTHER): Payer: Medicare Other

## 2023-12-29 ENCOUNTER — Ambulatory Visit (INDEPENDENT_AMBULATORY_CARE_PROVIDER_SITE_OTHER): Payer: Medicare Other | Admitting: Vascular Surgery

## 2023-12-29 ENCOUNTER — Encounter (INDEPENDENT_AMBULATORY_CARE_PROVIDER_SITE_OTHER): Payer: Self-pay | Admitting: Vascular Surgery

## 2023-12-29 VITALS — BP 131/86 | HR 73 | Resp 18 | Ht 60.0 in | Wt 126.2 lb

## 2023-12-29 DIAGNOSIS — I70211 Atherosclerosis of native arteries of extremities with intermittent claudication, right leg: Secondary | ICD-10-CM | POA: Diagnosis not present

## 2023-12-29 DIAGNOSIS — E7800 Pure hypercholesterolemia, unspecified: Secondary | ICD-10-CM | POA: Diagnosis not present

## 2023-12-29 DIAGNOSIS — I10 Essential (primary) hypertension: Secondary | ICD-10-CM

## 2023-12-29 DIAGNOSIS — I714 Abdominal aortic aneurysm, without rupture, unspecified: Secondary | ICD-10-CM

## 2023-12-29 DIAGNOSIS — I70221 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, right leg: Secondary | ICD-10-CM | POA: Diagnosis not present

## 2023-12-29 NOTE — Progress Notes (Signed)
 MRN : [id number]  Tracy Parsons is a 78 y.o. (1946-10-02) female who presents with chief complaint of  Chief Complaint  Patient presents with   Follow-up    f/u in 3 months with abi; EVAR  .  History of Present Illness: Patient returns today in follow up of her peripheral arterial disease and abdominal aortic aneurysm.  She is 2 years status post concomitant iliac artery revascularization for occlusive disease and abdominal aortic aneurysm repair.  She is doing quite well.  She denies any lifestyle limiting claudication, ischemic rest pain, or ulceration.  She has a lot of weakness in the right leg which is chronic and she asks about getting physical therapy for that which sounds like a great idea.  She does not have any aneurysm related symptoms.  Noninvasive studies today demonstrate a patent stent graft without endoleak.  The aortic sac diameter has decreased down to just under 3 cm in maximal diameter.  ABIs today are normal at 1.13 on the right and 1.0 on the left with multiphasic waveforms and normal digital pressures.  Current Outpatient Medications  Medication Sig Dispense Refill   cilostazol (PLETAL) 50 MG tablet TAKE ONE (1) TABLET BY MOUTH TWO TIMES PER DAY 180 tablet 1   levothyroxine (SYNTHROID) 75 MCG tablet Take 1 tablet (75 mcg total) by mouth daily. 90 tablet 1   lisinopril (ZESTRIL) 10 MG tablet Take 1 tablet (10 mg total) by mouth daily. 90 tablet 3   lovastatin (MEVACOR) 40 MG tablet Take 2 tablets (80 mg total) by mouth at bedtime. 180 tablet 1   sertraline (ZOLOFT) 25 MG tablet Take 2 tablets (50 mg total) by mouth daily. 180 tablet 1   Vitamin D, Ergocalciferol, (DRISDOL) 1.25 MG (50000 UNIT) CAPS capsule Take 1 capsule (50,000 Units total) by mouth every 7 (seven) days. 12 capsule 0   Multiple Vitamin (MULTIVITAMIN) tablet Take 1 tablet by mouth every morning. (Patient not taking: Reported on 12/29/2023)     No current facility-administered medications for this  visit.    Past Medical History:  Diagnosis Date   Anemia    Aortic atherosclerosis (HCC)    Atherosclerosis of native arteries of extremity with intermittent claudication (HCC)    CAD (coronary artery disease)    Depression    Diastolic dysfunction    a.) TTE 11/18/2021: EF 60-65%; triv MR; G1DD   Heart murmur    History of kidney stones    HLD (hyperlipidemia)    Hypertension    Hypothyroidism    Infrarenal abdominal aortic aneurysm (AAA) without rupture (HCC)    a.) CTA 09/16/2021: infrarenal AAA with mural thrombus measuring 5.2 x 3.6 cm   Long term current use of antithrombotics/antiplatelets    a.) ASA + cilostazol   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[facility])     Past Surgical History:  Procedure Laterality Date   ABDOMINAL HYSTERECTOMY     ENDOVASCULAR REPAIR/STENT GRAFT N/A 12/04/2021   Procedure: ENDOVASCULAR REPAIR/STENT GRAFT;  Surgeon: Annice Needy, MD;  Location: [facility] INVASIVE CV LAB;  Service: Cardiovascular;  Laterality:  N/A;   EYE SURGERY Bilateral    NASAL SINUS SURGERY  1970   shunt in head  2022     Social History   Tobacco Use   Smoking status: Every Day    Current packs/day: 0.25    Types: Cigarettes   Smokeless tobacco: Never  Vaping Use   Vaping status: Never Used  Substance Use Topics   Alcohol use: Yes    Alcohol/week: 1.0 - 2.0 standard drink of alcohol    Types: 1 - 2 Glasses of wine per week   Drug use: Never       Family History  Adopted: Yes     Allergies  Allergen Reactions   Lipitor [Atorvastatin] Other (See Comments)    Confusion     Sulfa Antibiotics Other (See Comments)    Unknown reaction Unknown-- reaction as child      REVIEW OF SYSTEMS (Negative unless checked)   Constitutional: [] Weight loss  [] Fever  [] Chills Cardiac: [] Chest pain   [] Chest pressure   [] Palpitations   [] Shortness of breath when laying flat   [] Shortness of breath at rest   [] Shortness of breath with exertion. Vascular:  [] Pain in legs with walking   [] Pain in legs at rest   [] Pain in legs when laying flat   [] Claudication   [x] Pain in feet when walking  [] Pain in feet at rest  [] Pain in feet when laying flat   [x] History of DVT   [] Phlebitis   [] Swelling in legs   [] Varicose veins   [] Non-healing ulcers Pulmonary:   [] Uses home oxygen   [] Productive cough   [] Hemoptysis   [] Wheeze  [] COPD   [] Asthma Neurologic:  [] Dizziness  [] Blackouts   [x] Seizures   [] History of stroke   [] History of TIA  [] Aphasia   [] Temporary blindness   [] Dysphagia   [] Weakness or numbness in arms   [] Weakness or numbness in legs Musculoskeletal:  [x] Arthritis   [] Joint swelling   [] Joint pain   [] Low back pain Hematologic:  [] Easy bruising  [] Easy bleeding   [] Hypercoagulable state   [x] Anemic   Gastrointestinal:  [] Blood in stool   [] Vomiting blood  [] Gastroesophageal reflux/heartburn   [] Abdominal pain Genitourinary:  [] Chronic kidney disease   [] Difficult urination  [] Frequent urination  [] Burning with urination   [] Hematuria Skin:  [] Rashes   [] Ulcers   [] Wounds Psychological:  [] History of anxiety   []  History of major depression.   Physical Examination  BP 131/86   Pulse 73   Resp 18   Ht 5' (1.524 m)   Wt 126 lb 3.2 oz (57.2 kg)   BMI 24.65 kg/m  Gen:  WD/WN, NAD Head: [place]/AT, No temporalis wasting. Ear/Nose/Throat: Hearing grossly intact, nares w/o erythema or drainage Eyes: Conjunctiva clear. Sclera non-icteric Neck: Supple.  Trachea midline Pulmonary:  Good air movement, no use of accessory muscles.  Cardiac: RRR, no JVD Vascular:  Vessel Right Left   Radial Palpable Palpable                          PT Palpable Palpable  DP Palpable Palpable   Gastrointestinal: soft, non-tender/non-distended. No guarding/reflex.  Musculoskeletal:   No deformity or atrophy. Uses a walker today due to right leg weakness. No edema. Neurologic: Sensation grossly intact in extremities.  Speech is fluent.  Psychiatric: Judgment intact, Mood & affect appropriate for pt's clinical situation. Dermatologic: No rashes or ulcers noted.  No cellulitis or open wounds.  Labs No results found for this or any previous visit (from the past 2160 hours).  Radiology No results found.  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  The aortic sac diameter has decreased down to just under 3 cm in maximal diameter.  ABIs today are normal at 1.13 on the right and 1.0 on the left with multiphasic waveforms and normal digital pressures.  Her perfusion is currently intact.  She is still having some weakness in the right leg and I think having physical therapy would be very helpful for this.  Recheck ABIs in 1 year.  AAA (abdominal aortic aneurysm) without rupture Noninvasive studies today demonstrate a patent stent graft without endoleak.  The aortic sac diameter has decreased down to just under 3 cm in maximal diameter.  This is very encouraging.  We can follow this on an annual basis going forward.  Essential hypertension blood pressure control important in reducing the progression of atherosclerotic disease. On appropriate oral medications.     Pure hypercholesterolemia lipid control important in reducing the progression of atherosclerotic disease. Continue statin therapy  Festus Barren, MD  12/29/2023 2:00 PM    This note was created with Dragon medical transcription system.  Any errors from dictation are purely  unintentional

## 2023-12-29 NOTE — Assessment & Plan Note (Signed)
 Noninvasive studies today demonstrate a patent stent graft without endoleak.  The aortic sac diameter has decreased down to just under 3 cm in maximal diameter.  This is very encouraging.  We can follow this on an annual basis going forward.

## 2023-12-29 NOTE — Assessment & Plan Note (Signed)
 Noninvasive studies today demonstrate a patent stent graft without endoleak.  The aortic sac diameter has decreased down to just under 3 cm in maximal diameter.  ABIs today are normal at 1.13 on the right and 1.0 on the left with multiphasic waveforms and normal digital pressures.  Her perfusion is currently intact.  She is still having some weakness in the right leg and I think having physical therapy would be very helpful for this.  Recheck ABIs in 1 year.

## 2023-12-31 LAB — VAS US ABI WITH/WO TBI
Left ABI: 1.1
Right ABI: 1.13

## 2024-02-08 ENCOUNTER — Other Ambulatory Visit: Payer: Self-pay | Admitting: Internal Medicine

## 2024-02-08 DIAGNOSIS — E039 Hypothyroidism, unspecified: Secondary | ICD-10-CM

## 2024-02-09 NOTE — Telephone Encounter (Signed)
 Requested Prescriptions  Refused Prescriptions Disp Refills   levothyroxine  (SYNTHROID ) 75 MCG tablet [Pharmacy Med Name: LEVOTHYROXINE  SODIUM 75 MCG TAB] 90 tablet 1    Sig: TAKE 1 TABLET BY MOUTH DAILY.     Endocrinology:  Hypothyroid Agents Failed - 02/09/2024  4:53 PM      Failed - Valid encounter within last 12 months    Recent Outpatient Visits   None     Future Appointments             In 1 month Tracy Cid, DO [place] [facility]            Passed - TSH in normal range and within 360 days    TSH  Date Value Ref Range Status  09/23/2023 0.84 0.40 - 4.50 mIU/L Final

## 2024-02-23 ENCOUNTER — Encounter (INDEPENDENT_AMBULATORY_CARE_PROVIDER_SITE_OTHER): Payer: Self-pay

## 2024-03-09 ENCOUNTER — Other Ambulatory Visit: Payer: Self-pay | Admitting: Internal Medicine

## 2024-03-09 DIAGNOSIS — I10 Essential (primary) hypertension: Secondary | ICD-10-CM

## 2024-03-10 NOTE — Telephone Encounter (Signed)
 Requested Prescriptions  Pending Prescriptions Disp Refills   lisinopril  (ZESTRIL ) 10 MG tablet [Pharmacy Med Name: LISINOPRIL  10 MG TAB] 90 tablet 0    Sig: TAKE 1 TABLET BY MOUTH DAILY     Cardiovascular:  ACE Inhibitors Failed - 03/10/2024  3:25 PM      Failed - Valid encounter within last 6 months    Recent Outpatient Visits   None     Future Appointments             In 1 week Rockney Cid, DO [place] [facility], [facility]            Passed - Cr in normal range and within 180 days    Creat  Date Value Ref Range Status  09/23/2023 0.95 0.60 - 1.00 mg/dL Final         Passed - K in normal range and within 180 days    Potassium  Date Value Ref Range Status  09/23/2023 4.9 3.5 - 5.3 mmol/L Final         Passed - Patient is not pregnant      Passed - Last BP in normal range    BP Readings from Last 1 Encounters:  12/29/23 131/86

## 2024-03-17 ENCOUNTER — Other Ambulatory Visit: Payer: Self-pay | Admitting: Internal Medicine

## 2024-03-17 DIAGNOSIS — F330 Major depressive disorder, recurrent, mild: Secondary | ICD-10-CM

## 2024-03-21 NOTE — Telephone Encounter (Signed)
 Requested Prescriptions  Pending Prescriptions Disp Refills   sertraline  (ZOLOFT ) 25 MG tablet [Pharmacy Med Name: SERTRALINE  HCL 25 MG TAB] 180 tablet 0    Sig: TAKE 2 TABLETS BY MOUTH DAILY.     Psychiatry:  Antidepressants - SSRI - sertraline  Failed - 03/21/2024  2:58 PM      Failed - Valid encounter within last 6 months    Recent Outpatient Visits   None     Future Appointments             In 2 days Tracy Cid, DO [place] [facility], [facility]            Passed - AST in normal range and within 360 days    AST  Date Value Ref Range Status  09/23/2023 12 10 - 35 U/L Final         Passed - ALT in normal range and within 360 days    ALT  Date Value Ref Range Status  09/23/2023 10 6 - 29 U/L Final         Passed - Completed PHQ-2 or PHQ-9 in the last 360 days

## 2024-03-23 ENCOUNTER — Encounter: Payer: Self-pay | Admitting: Internal Medicine

## 2024-03-23 ENCOUNTER — Ambulatory Visit: Payer: Self-pay | Admitting: Internal Medicine

## 2024-03-23 ENCOUNTER — Other Ambulatory Visit: Payer: Self-pay | Admitting: Internal Medicine

## 2024-03-23 ENCOUNTER — Other Ambulatory Visit: Payer: Self-pay

## 2024-03-23 VITALS — BP 128/80 | HR 77 | Temp 98.9°F | Resp 14 | Ht 60.0 in | Wt 126.2 lb

## 2024-03-23 DIAGNOSIS — N3000 Acute cystitis without hematuria: Secondary | ICD-10-CM

## 2024-03-23 DIAGNOSIS — I739 Peripheral vascular disease, unspecified: Secondary | ICD-10-CM | POA: Diagnosis not present

## 2024-03-23 DIAGNOSIS — I10 Essential (primary) hypertension: Secondary | ICD-10-CM

## 2024-03-23 DIAGNOSIS — E7800 Pure hypercholesterolemia, unspecified: Secondary | ICD-10-CM

## 2024-03-23 DIAGNOSIS — E039 Hypothyroidism, unspecified: Secondary | ICD-10-CM | POA: Diagnosis not present

## 2024-03-23 DIAGNOSIS — F330 Major depressive disorder, recurrent, mild: Secondary | ICD-10-CM

## 2024-03-23 DIAGNOSIS — N3942 Incontinence without sensory awareness: Secondary | ICD-10-CM

## 2024-03-23 DIAGNOSIS — N3090 Cystitis, unspecified without hematuria: Secondary | ICD-10-CM

## 2024-03-23 LAB — POCT URINALYSIS DIPSTICK
Bilirubin, UA: NEGATIVE
Glucose, UA: NEGATIVE
Ketones, UA: NEGATIVE
Nitrite, UA: POSITIVE
Protein, UA: POSITIVE — AB
Spec Grav, UA: 1.02 (ref 1.010–1.025)
Urobilinogen, UA: 0.2 U/dL
pH, UA: 5 (ref 5.0–8.0)

## 2024-03-23 MED ORDER — LEVOTHYROXINE SODIUM 75 MCG PO TABS: 75.0000 ug | ORAL_TABLET | Freq: Every day | ORAL | 1 refills | Status: AC

## 2024-03-23 MED ORDER — NITROFURANTOIN MONOHYD MACRO 100 MG PO CAPS: 100.0000 mg | ORAL_CAPSULE | Freq: Two times a day (BID) | ORAL | 0 refills | Status: AC

## 2024-03-23 MED ORDER — LOVASTATIN 40 MG PO TABS: 80.0000 mg | ORAL_TABLET | Freq: Every day | ORAL | 1 refills | Status: AC

## 2024-03-23 NOTE — Progress Notes (Signed)
 Established Patient Office Visit  Subjective   Patient ID: Tracy Parsons, female    DOB: 02-04-1946  Age: 78 y.o. MRN: [id number]  Chief Complaint  Patient presents with   Medical Management of Chronic Issues    6 month recheck    HPI  Patient is here for follow up on chronic medical conditions.   Discussed the use of AI scribe software for clinical note transcription with the patient, who gave verbal consent to proceed.  History of Present Illness  Tracy Parsons is an 78 year old female with hydrocephalus who presents with urinary incontinence.  She experiences daily and nocturnal urinary incontinence, often without realizing the need to urinate until it occurs. She has a valve or stent in her head from hydrocephalus treatment and questions if it could be related to her bladder control issues. There is no urinary pain, burning, hematuria, new pelvic, abdominal, or back pain, and no fevers.  Her medical history includes hypothyroidism, managed with levothyroxine  75 mcg daily, hypertension managed with lisinopril , and cholesterol management with recent lab results showing improvement. She takes sertraline  50 mg daily for mood management with no issues.  Hypertension: -Medications: Lisinopril  10 mg -Patient is compliant with above medications and reports no side effects. -Checking BP at home (average): not checking currently  -Denies any SOB, CP, vision changes, LE edema or symptoms of hypotension  AAA/PAD: -CTA 12/22 showing AAA measuring 5.2 x 3.6 cm, recheck in 3/24 diameter 3.6 cm -Underwent open endovascular repair with multiple stents placed 12/04/21 at [facility], recovering well.  -Following with vascular surgery -Working with physical therapy short walks, exercise bike and doing well   HLD/CAD/PAD: -Medications: Lovastatin  40 mg -Patient is compliant with above medications and reports no side effects.  -Last lipid panel: Lipid Panel     Component  Value Date/Time   CHOL 206 (H) 09/23/2023 0840   TRIG 111 09/23/2023 0840   HDL 61 09/23/2023 0840   CHOLHDL 3.4 09/23/2023 0840   LDLCALC 123 (H) 09/23/2023 0840   The ASCVD Risk score (Arnett DK, et al., 2019) failed to calculate for the following reasons:   The 2019 ASCVD risk score is only valid for ages 83 to 69  Hypothyroidism: -Medications: Levothyroxine  75 mcg, been on this dose for years -Patient is compliant with the above medication (s) at the above dose and reports no medication side effects.  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 Small chronic subdural hematoma overlying left cerebral hemisphere measuring 0.6 cm which is decreased in size.  -Was seen in the ER 03/07/22 for headache. Had 2 head CT's, first showed moderate hydrocephalus despite ventriculostomy catheter in place. Trace 2 mm thick left cerebral convexity subdural thickening vs. Subdural hematoma, possibly chronic with patchy periventricular white matter hypo-densities nonspecific possible representing periventricular edema vs chronic microvascular disease. Second a few hours later showed unchanged thin left cerebral convexity subdural thickening vs minimal subdural hematoma without associated mass effect. Stable hydrocephalus with ventriculostomy catheter in place.   MDD: -Mood status: stable  -Current treatment: Zoloft  50 mg  -Satisfied with current treatment?: yes -Symptom severity: mild  -  Duration of current treatment : weeks Medication compliance: excellent compliance     03/23/2024     9:23 AM 09/23/2023    8:01 AM 08/27/2023   11:36 AM 03/20/2023    1:02 PM 06/26/2022   10:56 AM  Depression screen PHQ 2/9  Decreased Interest 0 0 0 0 0  Down, Depressed, Hopeless 0 0 0 0 0  PHQ - 2 Score 0 0 0 0 0  Altered sleeping  0 0 0   Tired, decreased energy  0 0 0   Change in appetite  0 0 1   Feeling bad or failure about yourself   0 0 0   Trouble concentrating  0 0 0   Moving slowly or fidgety/restless  0 0 0   Suicidal thoughts  0 0 0   PHQ-9 Score  0 0 1   Difficult doing work/chores  Not difficult at all Not difficult at all Not difficult at all     Health Maintenance: -Blood work UTD -Due to patient request, all screenings such as mammograms, DEXA, colonoscopies will be discontinued   Patient Active Problem List   Diagnosis Date Noted   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, right leg (HCC) 09/23/2023   UTI (urinary tract infection) 08/23/2023   Severe sepsis (HCC) 08/23/2023   Syncope 08/22/2023   Chronic diastolic CHF (congestive heart failure) (HCC) 08/22/2023   Hypotension 08/22/2023   HLD (hyperlipidemia) 08/22/2023   Hypokalemia 08/22/2023   Renal lesion_left 08/22/2023   Elevated lactic acid level 08/22/2023   Falls 02/27/2022   History of kidney stones 12/20/2021   Hypothyroidism 12/20/2021   Hx of traumatic subdural hematoma 12/20/2021   Mild episode of recurrent major depressive disorder (HCC) 12/20/2021   AAA (abdominal aortic aneurysm) without rupture (HCC) 12/04/2021   Other hydrocephalus (HCC) 07/02/2021   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   Abdominal aortic aneurysm (AAA) without rupture (HCC) 03/20/2021   Atherosclerosis of native arteries of extremity with intermittent claudication (HCC) 03/20/2021   Essential hypertension 03/20/2021   PAD (peripheral artery disease) (HCC) 03/20/2021   Pure hypercholesterolemia 03/20/2021   Tobacco abuse 03/20/2021   NPH (normal pressure hydrocephalus) (HCC) 01/08/2021   Past Medical  History:  Diagnosis Date   Anemia    Aortic atherosclerosis (HCC)    Atherosclerosis of native arteries of extremity with intermittent claudication (HCC)    CAD (coronary artery disease)    Depression    Diastolic dysfunction    a.) TTE 11/18/2021: EF 60-65%; triv MR; G1DD   Heart murmur    History of kidney stones    HLD (hyperlipidemia)    Hypertension    Hypothyroidism    Infrarenal abdominal aortic aneurysm (AAA) without rupture (HCC)    a.) CTA 09/16/2021: infrarenal AAA with mural thrombus measuring 5.2 x 3.6 cm   Long term current use of antithrombotics/antiplatelets    a.) ASA + cilostazol    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[facility])    Past Surgical History:  Procedure Laterality Date   ABDOMINAL HYSTERECTOMY     ENDOVASCULAR REPAIR/STENT GRAFT N/A  12/04/2021   Procedure: ENDOVASCULAR REPAIR/STENT GRAFT;  Surgeon: Celso College, MD;  Location: [facility] INVASIVE CV LAB;  Service: Cardiovascular;  Laterality: N/A;   EYE SURGERY Bilateral    NASAL SINUS SURGERY  1970   shunt in head  2022   Social History   Tobacco Use   Smoking status: Every Day    Current packs/day: 0.25    Types: Cigarettes   Smokeless tobacco: Never  Vaping Use   Vaping status: Never Used  Substance Use Topics   Alcohol use: Yes    Alcohol/week: 1.0 - 2.0 standard drink of alcohol    Types: 1 - 2 Glasses of wine per week   Drug use: Never   Social History   Socioeconomic History   Marital status: Married    Spouse name: Not on file   Number of children: Not on file   Years of education: Not on file    Highest education level: Not on file  Occupational History   Not on file  Tobacco Use   Smoking status: Every Day    Current packs/day: 0.25    Types: Cigarettes   Smokeless tobacco: Never  Vaping Use   Vaping status: Never Used  Substance and Sexual Activity   Alcohol use: Yes    Alcohol/week: 1.0 - 2.0 standard drink of alcohol    Types: 1 - 2 Glasses of wine per week   Drug use: Never   Sexual activity: Not Currently  Other Topics Concern   Not on file  Social History Narrative   Not on file   Social Drivers of Health   Financial Resource Strain: Low Risk  (06/26/2022)   Overall Financial Resource Strain (CARDIA)    Difficulty of Paying Living Expenses: Not hard at all  Food Insecurity: No Food Insecurity (08/22/2023)   Hunger Vital Sign    Worried About Running Out of Food in the Last Year: Never true    Ran Out of Food in the Last Year: Never true  Transportation Needs: No Transportation Needs (08/22/2023)   PRAPARE - Administrator, Civil Service (Medical): No    Lack of Transportation (Non-Medical): No  Physical Activity: Inactive (06/26/2022)   Exercise Vital Sign    Days of Exercise per Week: 0 days    Minutes of Exercise per Session: 0 min  Stress: No Stress Concern Present (06/26/2022)   [employer] of Occupational Health - Occupational Stress Questionnaire    Feeling of Stress : Not at all  Social Connections: Moderately Isolated (06/26/2022)   Social Connection and Isolation Panel    Frequency of Communication with Friends and Family: More than three times a week    Frequency of Social Gatherings with Friends and Family: More than three times a week    Attends Religious Services: Never    Database administrator or Organizations: No    Attends Banker Meetings: Never    Marital Status: Married  Catering manager Violence: Not At Risk (08/22/2023)   Humiliation, Afraid, Rape, and Kick questionnaire    Fear of Current or  Ex-Partner: No    Emotionally Abused: No    Physically Abused: No    Sexually Abused: No   No family status information on file.   Family History  Adopted: Yes   Allergies  Allergen Reactions   Lipitor [Atorvastatin] Other (See Comments)    Confusion    Sulfa Antibiotics Other (See Comments)    Unknown reaction Unknown-- reaction  as child       Review of Systems  Constitutional:  Negative for chills and fever.  Gastrointestinal:  Negative for abdominal pain.  Genitourinary:  Negative for dysuria, flank pain, frequency, hematuria and urgency.  All other systems reviewed and are negative.     Objective:     BP 128/80 (Cuff Size: Normal)   Pulse 77   Temp 98.9 F (37.2 C) (Oral)   Resp 14   Ht 5' (1.524 m)   Wt 126 lb 3.2 oz (57.2 kg)   SpO2 99%   BMI 24.65 kg/m  BP Readings from Last 3 Encounters:  03/23/24 128/80  12/29/23 131/86  09/23/23 122/76   Wt Readings from Last 3 Encounters:  03/23/24 126 lb 3.2 oz (57.2 kg)  12/29/23 126 lb 3.2 oz (57.2 kg)  09/23/23 123 lb 3.2 oz (55.9 kg)      Physical Exam Constitutional:      Appearance: Normal appearance.  HENT:     Head: Normocephalic and atraumatic.   Eyes:     Conjunctiva/sclera: Conjunctivae normal.    Cardiovascular:     Rate and Rhythm: Normal rate and regular rhythm.  Pulmonary:     Effort: Pulmonary effort is normal.     Breath sounds: Normal breath sounds.   Skin:    General: Skin is warm and dry.   Neurological:     General: No focal deficit present.     Mental Status: She is alert. Mental status is at baseline.   Psychiatric:        Mood and Affect: Mood normal.        Behavior: Behavior normal.      No results found for any visits on 03/23/24.  Last CBC Lab Results  Component Value Date   WBC 6.7 09/23/2023   HGB 13.4 09/23/2023   HCT 40.7 09/23/2023   MCV 93.1 09/23/2023   MCH 30.7 09/23/2023   RDW 13.4 09/23/2023   PLT 343 09/23/2023   Last metabolic  panel Lab Results  Component Value Date   GLUCOSE 93 09/23/2023   NA 138 09/23/2023   K 4.9 09/23/2023   CL 105 09/23/2023   CO2 25 09/23/2023   BUN 21 09/23/2023   CREATININE 0.95 09/23/2023   GFRNONAA >60 08/23/2023   CALCIUM  10.0 09/23/2023   PHOS 3.0 08/22/2023   PROT 7.0 09/23/2023   ALBUMIN  3.9 08/22/2023   BILITOT 0.3 09/23/2023   ALKPHOS 74 08/22/2023   AST 12 09/23/2023   ALT 10 09/23/2023   ANIONGAP 6 08/23/2023   Last lipids Lab Results  Component Value Date   CHOL 206 (H) 09/23/2023   HDL 61 09/23/2023   LDLCALC 123 (H) 09/23/2023   TRIG 111 09/23/2023   CHOLHDL 3.4 09/23/2023   Last hemoglobin A1c Lab Results  Component Value Date   HGBA1C 5.6 10/31/2021   Last thyroid functions Lab Results  Component Value Date   TSH 0.84 09/23/2023   Last vitamin D  Lab Results  Component Value Date   VD25OH 26 (L) 09/23/2023   Last vitamin B12 and Folate No results found for: VITAMINB12, FOLATE    The ASCVD Risk score (Arnett DK, et al., 2019) failed to calculate for the following reasons:   The 2019 ASCVD risk score is only valid for ages 44 to 69    Assessment & Plan:   Assessment & Plan  Urinary Incontinence/Cystitis 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 Referral to urology for nerve-related evaluation discussed. - Refer  to urology for neuropathic bladder evaluation. - Order urinalysis to rule out infection - positive for cystitis with leukocytes and nitrates. Treat with Macrobid x 5 days and send for urine culture.  - Assist with incontinence supplies if needed.  Hypothyroidism Thyroid function improved with levothyroxine  75 mcg. No adherence issues or side effects. - Continue levothyroxine  75 mcg daily. - Send refill for thyroid medication.  Hypertension Blood pressure controlled at 128/80 mmHg with current regimen. - Continue current antihypertensive medication regimen. - Send refill for  lisinopril .  Hyperlipidemia/PAD Cholesterol levels improved with current medication. Adherent to regimen. - Continue current cholesterol medication. - Send refill for cholesterol medication.  Depression Improvement in symptoms with sertraline  50 mg daily. Prefers current dosing method for flexibility. - Continue sertraline  50 mg daily. - Send refill for sertraline .  - Ambulatory referral to Urology - POCT Urinalysis Dipstick - levothyroxine  (SYNTHROID ) 75 MCG tablet; Take 1 tablet (75 mcg total) by mouth daily.  Dispense: 90 tablet; Refill: 1 - lovastatin  (MEVACOR ) 40 MG tablet; Take 2 tablets (80 mg total) by mouth at bedtime.  Dispense: 180 tablet; Refill: 1   Return in about 6 months (around 09/22/2024).    Rockney Cid, DO

## 2024-06-27 ENCOUNTER — Encounter: Payer: Self-pay | Admitting: Urology

## 2024-07-04 ENCOUNTER — Ambulatory Visit: Admitting: Urology

## 2024-07-11 ENCOUNTER — Ambulatory Visit: Admitting: Urology

## 2024-07-15 ENCOUNTER — Telehealth: Payer: Self-pay

## 2024-07-15 NOTE — Progress Notes (Signed)
 Pharmacy Quality Measure Review  This patient is appearing on a report for being at risk of failing the adherence measure for cholesterol (statin) and hypertension (ACEi/ARB) medications this calendar year.   Medication: lisinopril  Last fill date: 03/10/24 for 90 day supply  Medication: lovastatin  Last fill date: 03/23/24 for 90 day supply  Left voicemail for patient to return my call at their convenience.  Tristan Bramble E. Marsh, PharmD Clinical Pharmacist [facility] [phone]

## 2024-09-22 ENCOUNTER — Ambulatory Visit: Admitting: Internal Medicine

## 2024-10-21 ENCOUNTER — Telehealth: Payer: Self-pay

## 2024-10-21 NOTE — Telephone Encounter (Addendum)
 Pharmacy Quality Measure Review  This patient is appearing on a report for being at risk of failing the adherence measure for cholesterol (statin) and hypertension (ACEi/ARB) medications this calendar year.   Medication: lisinopril  10 mg  Last fill date: 09/27/24 for 90 day supply Insurance report was not up to date. No action needed at this time.    Medication: lovastatin  40 mg  Last fill date: 07/26/24 for 90 day supply Patient is due for appointment with PCP. No refills on file at this time. Will route to admin team for rescheduling purposes.   Ewel Lona E. Marsh, PharmD, BCACP, CPP Clinical Pharmacist [facility] [phone]

## 2024-10-25 NOTE — Telephone Encounter (Signed)
 Tried to call pt . No answer.

## 2024-12-27 ENCOUNTER — Other Ambulatory Visit (INDEPENDENT_AMBULATORY_CARE_PROVIDER_SITE_OTHER)

## 2024-12-27 ENCOUNTER — Encounter (INDEPENDENT_AMBULATORY_CARE_PROVIDER_SITE_OTHER)

## 2024-12-27 ENCOUNTER — Ambulatory Visit (INDEPENDENT_AMBULATORY_CARE_PROVIDER_SITE_OTHER): Admitting: Vascular Surgery
